# Patient Record
Sex: Male | Born: 1945 | Race: White | Hispanic: No | Marital: Married | Smoking: Current every day smoker
Health system: Southern US, Community
[De-identification: ages and names within clinical notes are randomized; demographics above are authoritative.]

## PROBLEM LIST (undated history)

## (undated) DIAGNOSIS — F329 Major depressive disorder, single episode, unspecified: Secondary | ICD-10-CM

## (undated) DIAGNOSIS — J309 Allergic rhinitis, unspecified: Secondary | ICD-10-CM

## (undated) DIAGNOSIS — F32A Depression, unspecified: Secondary | ICD-10-CM

## (undated) DIAGNOSIS — B059 Measles without complication: Secondary | ICD-10-CM

## (undated) DIAGNOSIS — I4949 Other premature depolarization: Secondary | ICD-10-CM

## (undated) DIAGNOSIS — B269 Mumps without complication: Secondary | ICD-10-CM

## (undated) DIAGNOSIS — J449 Chronic obstructive pulmonary disease, unspecified: Secondary | ICD-10-CM

## (undated) DIAGNOSIS — D649 Anemia, unspecified: Secondary | ICD-10-CM

## (undated) DIAGNOSIS — B019 Varicella without complication: Secondary | ICD-10-CM

## (undated) HISTORY — DX: Allergic rhinitis, unspecified: J30.9

## (undated) HISTORY — DX: Anemia, unspecified: D64.9

## (undated) HISTORY — DX: Varicella without complication: B01.9

## (undated) HISTORY — DX: Mumps without complication: B26.9

## (undated) HISTORY — DX: Other premature depolarization: I49.49

## (undated) HISTORY — PX: KNEE SURGERY: SHX244

## (undated) HISTORY — PX: CHEST SURGERY: SHX595

## (undated) HISTORY — PX: TONSILECTOMY/ADENOIDECTOMY WITH MYRINGOTOMY: SHX6125

## (undated) HISTORY — DX: Measles without complication: B05.9

## (undated) HISTORY — PX: TONSILLECTOMY: SUR1361

---

## 2012-10-08 DIAGNOSIS — F17200 Nicotine dependence, unspecified, uncomplicated: Secondary | ICD-10-CM | POA: Diagnosis not present

## 2012-10-08 DIAGNOSIS — Z0000 Encounter for general adult medical examination without abnormal findings: Secondary | ICD-10-CM | POA: Diagnosis not present

## 2012-10-08 DIAGNOSIS — Z23 Encounter for immunization: Secondary | ICD-10-CM | POA: Diagnosis not present

## 2012-10-15 DIAGNOSIS — F17200 Nicotine dependence, unspecified, uncomplicated: Secondary | ICD-10-CM | POA: Diagnosis not present

## 2012-10-15 DIAGNOSIS — R0602 Shortness of breath: Secondary | ICD-10-CM | POA: Diagnosis not present

## 2012-10-15 DIAGNOSIS — J449 Chronic obstructive pulmonary disease, unspecified: Secondary | ICD-10-CM | POA: Diagnosis not present

## 2012-11-01 DIAGNOSIS — J449 Chronic obstructive pulmonary disease, unspecified: Secondary | ICD-10-CM | POA: Diagnosis not present

## 2012-11-12 DIAGNOSIS — Z23 Encounter for immunization: Secondary | ICD-10-CM | POA: Diagnosis not present

## 2012-11-12 DIAGNOSIS — I4949 Other premature depolarization: Secondary | ICD-10-CM | POA: Diagnosis not present

## 2012-11-12 DIAGNOSIS — J449 Chronic obstructive pulmonary disease, unspecified: Secondary | ICD-10-CM | POA: Diagnosis not present

## 2012-11-12 DIAGNOSIS — F17200 Nicotine dependence, unspecified, uncomplicated: Secondary | ICD-10-CM | POA: Diagnosis not present

## 2013-02-25 DIAGNOSIS — J309 Allergic rhinitis, unspecified: Secondary | ICD-10-CM | POA: Diagnosis not present

## 2013-02-25 DIAGNOSIS — J449 Chronic obstructive pulmonary disease, unspecified: Secondary | ICD-10-CM | POA: Diagnosis not present

## 2013-02-25 DIAGNOSIS — F17200 Nicotine dependence, unspecified, uncomplicated: Secondary | ICD-10-CM | POA: Diagnosis not present

## 2013-05-02 DIAGNOSIS — J449 Chronic obstructive pulmonary disease, unspecified: Secondary | ICD-10-CM | POA: Diagnosis not present

## 2013-05-02 DIAGNOSIS — R0609 Other forms of dyspnea: Secondary | ICD-10-CM | POA: Diagnosis not present

## 2013-08-02 DIAGNOSIS — J449 Chronic obstructive pulmonary disease, unspecified: Secondary | ICD-10-CM | POA: Diagnosis not present

## 2013-08-02 DIAGNOSIS — Z0000 Encounter for general adult medical examination without abnormal findings: Secondary | ICD-10-CM | POA: Diagnosis not present

## 2013-11-08 DIAGNOSIS — J449 Chronic obstructive pulmonary disease, unspecified: Secondary | ICD-10-CM | POA: Diagnosis not present

## 2013-11-08 DIAGNOSIS — J432 Centrilobular emphysema: Secondary | ICD-10-CM | POA: Insufficient documentation

## 2013-11-08 DIAGNOSIS — R05 Cough: Secondary | ICD-10-CM | POA: Diagnosis not present

## 2013-11-08 DIAGNOSIS — R059 Cough, unspecified: Secondary | ICD-10-CM | POA: Diagnosis not present

## 2013-11-08 DIAGNOSIS — J40 Bronchitis, not specified as acute or chronic: Secondary | ICD-10-CM | POA: Diagnosis not present

## 2013-11-28 DIAGNOSIS — Z23 Encounter for immunization: Secondary | ICD-10-CM | POA: Diagnosis not present

## 2014-02-08 DIAGNOSIS — J449 Chronic obstructive pulmonary disease, unspecified: Secondary | ICD-10-CM | POA: Diagnosis not present

## 2014-02-08 DIAGNOSIS — Z23 Encounter for immunization: Secondary | ICD-10-CM | POA: Diagnosis not present

## 2014-02-08 DIAGNOSIS — Z720 Tobacco use: Secondary | ICD-10-CM | POA: Diagnosis not present

## 2014-02-08 DIAGNOSIS — J302 Other seasonal allergic rhinitis: Secondary | ICD-10-CM | POA: Diagnosis not present

## 2014-02-08 DIAGNOSIS — R062 Wheezing: Secondary | ICD-10-CM | POA: Diagnosis not present

## 2014-03-01 DIAGNOSIS — J449 Chronic obstructive pulmonary disease, unspecified: Secondary | ICD-10-CM | POA: Diagnosis not present

## 2014-03-01 LAB — LIPID PANEL
CHOLESTEROL: 234 mg/dL — AB (ref 0–200)
HDL: 51 mg/dL (ref 35–70)
LDL CALC: 146 mg/dL
LDl/HDL Ratio: 2.9
Triglycerides: 185 mg/dL — AB (ref 40–160)

## 2014-03-01 LAB — BASIC METABOLIC PANEL
BUN: 17 mg/dL (ref 4–21)
Creatinine: 1 mg/dL (ref 0.6–1.3)
Glucose: 70 mg/dL
Potassium: 4.3 mmol/L (ref 3.4–5.3)
Sodium: 140 mmol/L (ref 137–147)

## 2014-03-01 LAB — HEPATIC FUNCTION PANEL
ALT: 19 U/L (ref 10–40)
AST: 24 U/L (ref 14–40)
Alkaline Phosphatase: 71 U/L (ref 25–125)
BILIRUBIN, TOTAL: 0.5 mg/dL

## 2014-03-01 LAB — CBC AND DIFFERENTIAL
HCT: 46 % (ref 41–53)
Hemoglobin: 15 g/dL (ref 13.5–17.5)
Neutrophils Absolute: 5 /uL
Platelets: 223 10*3/uL (ref 150–399)
WBC: 7.5 10*3/mL

## 2014-06-07 DIAGNOSIS — J449 Chronic obstructive pulmonary disease, unspecified: Secondary | ICD-10-CM | POA: Diagnosis not present

## 2014-06-10 DIAGNOSIS — J302 Other seasonal allergic rhinitis: Secondary | ICD-10-CM | POA: Insufficient documentation

## 2014-06-10 DIAGNOSIS — F17200 Nicotine dependence, unspecified, uncomplicated: Secondary | ICD-10-CM | POA: Insufficient documentation

## 2014-08-10 ENCOUNTER — Encounter: Payer: Self-pay | Admitting: Family Medicine

## 2014-08-10 ENCOUNTER — Ambulatory Visit (INDEPENDENT_AMBULATORY_CARE_PROVIDER_SITE_OTHER): Payer: Medicare Other | Admitting: Family Medicine

## 2014-08-10 VITALS — BP 139/79 | HR 79 | Resp 16 | Ht 67.0 in | Wt 161.2 lb

## 2014-08-10 DIAGNOSIS — E785 Hyperlipidemia, unspecified: Secondary | ICD-10-CM | POA: Insufficient documentation

## 2014-08-10 DIAGNOSIS — J438 Other emphysema: Secondary | ICD-10-CM | POA: Diagnosis not present

## 2014-08-10 MED ORDER — ATORVASTATIN CALCIUM 10 MG PO TABS: 10.0000 mg | ORAL_TABLET | Freq: Every day | ORAL | Status: DC

## 2014-08-10 NOTE — Progress Notes (Signed)
Name: Derek Mayo   MRN: [id number]    DOB: 07-18-1945   Date:08/10/2014       Progress Note  Subjective  Chief Complaint  Chief Complaint  Patient presents with  . COPD  . Hyperlipidemia    HPI   For f/u of COPD.  Breathing doing well.   Follows with Dr. Raul Del also. Has elevated lipids from recent lab work.  C/o nasal allergies regularly.  Past Medical History  Diagnosis Date  . Chicken pox   . Mumps   . Measles   . Anemia   . Allergic rhinitis   . Premature beats     Past Surgical History  Procedure Laterality Date  . Tonsilectomy/adenoidectomy with myringotomy      Family History  Problem Relation Age of Onset  . Heart disease Father     History   Social History  . Marital Status: Married    Spouse Name: N/A  . Number of Children: N/A  . Years of Education: N/A   Occupational History  . Not on file.   Social History Main Topics  . Smoking status: Current Some Day Smoker    Types: Cigars    Last Attempt to Quit: 08/13/2012  . Smokeless tobacco: Never Used  . Alcohol Use: No  . Drug Use: No     Comment: socially only  . Sexual Activity: Not on file   Other Topics Concern  . Not on file   Social History Narrative     Current outpatient prescriptions:  .  albuterol (VENTOLIN HFA) 108 (90 BASE) MCG/ACT inhaler, INHALE 2 PUFFS 4 TIMES A DAY AS NEEDED, Disp: , Rfl:  .  fluticasone (FLONASE) 50 MCG/ACT nasal spray, Place into the nose., Disp: , Rfl:  .  fluticasone (FLOVENT HFA) 110 MCG/ACT inhaler, Inhale into the lungs., Disp: , Rfl:  .  Umeclidinium Bromide (INCRUSE ELLIPTA) 62.5 MCG/INH AEPB, INHALE 1 INHALATION INTO THE LUNGS ONCE DAILY., Disp: , Rfl:  .  atorvastatin (LIPITOR) 10 MG tablet, Take 1 tablet (10 mg total) by mouth daily., Disp: 90 tablet, Rfl: 3  No Known Allergies   Review of Systems  Constitutional: Negative.  Negative for fever, chills, weight loss and malaise/fatigue.  HENT: Positive for congestion.   Eyes: Negative.   Negative for blurred vision and double vision.  Respiratory: Negative.  Negative for cough, sputum production, shortness of breath and wheezing.   Cardiovascular: Negative.  Negative for chest pain, palpitations, orthopnea, claudication and leg swelling.  Gastrointestinal: Negative.  Negative for nausea, vomiting, abdominal pain and diarrhea.  Musculoskeletal: Negative.   Skin: Negative.  Negative for rash.  Neurological: Negative.  Negative for weakness and headaches.      Objective  Filed Vitals:   08/10/14 0842  BP: 139/79  Pulse: 79  Resp: 16  Height: 5\' 7"  (1.702 m)  Weight: 161 lb 3.2 oz (73.12 kg)  SpO2: 96%    Physical Exam  Constitutional: He is oriented to person, place, and time and well-developed, well-nourished, and in no distress. No distress.  HENT:  Head: Normocephalic and atraumatic.  Right Ear: External ear normal.  Left Ear: External ear normal.  Nose: Mucosal edema and rhinorrhea present. Right sinus exhibits no maxillary sinus tenderness and no frontal sinus tenderness. Left sinus exhibits no maxillary sinus tenderness and no frontal sinus tenderness.  Mouth/Throat: Oropharynx is clear and moist.  Eyes: EOM are normal. Pupils are equal, round, and reactive to light. No scleral icterus.  Neck: Normal range of  motion. Neck supple. No thyromegaly present.  Cardiovascular: Normal rate, regular rhythm, normal heart sounds and intact distal pulses.  Exam reveals no gallop and no friction rub.   No murmur heard. Pulmonary/Chest: Effort normal and breath sounds normal. No respiratory distress. He has no wheezes. He has no rales.  Abdominal: Soft. Bowel sounds are normal. He exhibits no distension. There is no tenderness. There is no rebound.  Musculoskeletal: He exhibits no edema.  Lymphadenopathy:    He has no cervical adenopathy.  Neurological: He is alert and oriented to person, place, and time.  Vitals reviewed.        Assessment & Plan  Problem  List Items Addressed This Visit      Respiratory   Chronic airway obstruction - Primary   Relevant Medications   albuterol (VENTOLIN HFA) 108 (90 BASE) MCG/ACT inhaler   Umeclidinium Bromide (INCRUSE ELLIPTA) 62.5 MCG/INH AEPB   Other Relevant Orders   Pulse oximetry (single)     Other   Dyslipidemia   Relevant Medications   atorvastatin (LIPITOR) 10 MG tablet      Meds ordered this encounter  Medications  . albuterol (VENTOLIN HFA) 108 (90 BASE) MCG/ACT inhaler    Sig: INHALE 2 PUFFS 4 TIMES A DAY AS NEEDED  . Umeclidinium Bromide (INCRUSE ELLIPTA) 62.5 MCG/INH AEPB    Sig: INHALE 1 INHALATION INTO THE LUNGS ONCE DAILY.  Marland Kitchen atorvastatin (LIPITOR) 10 MG tablet    Sig: Take 1 tablet (10 mg total) by mouth daily.    Dispense:  90 tablet    Refill:  3    1. Other emphysema  - Pulse oximetry (single); Future  2. Dyslipidemia  - atorvastatin (LIPITOR) 10 MG tablet; Take 1 tablet (10 mg total) by mouth daily.  Dispense: 90 tablet; Refill: 3

## 2014-08-10 NOTE — Patient Instructions (Signed)
Plan lipid panel prior to return.

## 2014-10-27 ENCOUNTER — Telehealth: Payer: Self-pay | Admitting: Family Medicine

## 2014-10-27 NOTE — Telephone Encounter (Signed)
Pt was told to come by and pick up lab order about 2 weeks before appt to check cholesterol.  Please call [phone] when ready to be picked up.Marland Kitchen

## 2014-10-31 ENCOUNTER — Other Ambulatory Visit: Payer: Self-pay | Admitting: Family Medicine

## 2014-10-31 ENCOUNTER — Other Ambulatory Visit: Payer: Self-pay

## 2014-10-31 DIAGNOSIS — E785 Hyperlipidemia, unspecified: Secondary | ICD-10-CM

## 2014-10-31 DIAGNOSIS — J441 Chronic obstructive pulmonary disease with (acute) exacerbation: Secondary | ICD-10-CM

## 2014-10-31 NOTE — Telephone Encounter (Signed)
Pt notified reg lab requisition and advised that it's good for 6 week from today.

## 2014-10-31 NOTE — Telephone Encounter (Signed)
He should have orders for a Lipid Panel and a CMP.-jh

## 2014-10-31 NOTE — Telephone Encounter (Signed)
Does he need lipid panel , BMP and CBC or anything more he has last lab done in 02/2014 and heptic function test too. Please suggest ? Derek Mayo

## 2014-10-31 NOTE — Telephone Encounter (Signed)
Lab is ordered and LMTCB

## 2014-11-01 DIAGNOSIS — J441 Chronic obstructive pulmonary disease with (acute) exacerbation: Secondary | ICD-10-CM | POA: Diagnosis not present

## 2014-11-01 DIAGNOSIS — E785 Hyperlipidemia, unspecified: Secondary | ICD-10-CM | POA: Diagnosis not present

## 2014-11-02 LAB — COMPREHENSIVE METABOLIC PANEL
ALK PHOS: 73 IU/L (ref 39–117)
ALT: 23 IU/L (ref 0–44)
AST: 25 IU/L (ref 0–40)
Albumin/Globulin Ratio: 2.1 (ref 1.1–2.5)
Albumin: 4.5 g/dL (ref 3.6–4.8)
BUN/Creatinine Ratio: 17 (ref 10–22)
BUN: 17 mg/dL (ref 8–27)
Bilirubin Total: 0.5 mg/dL (ref 0.0–1.2)
CO2: 24 mmol/L (ref 18–29)
Calcium: 10 mg/dL (ref 8.6–10.2)
Chloride: 99 mmol/L (ref 97–108)
Creatinine, Ser: 1.03 mg/dL (ref 0.76–1.27)
GFR calc Af Amer: 86 mL/min/{1.73_m2} (ref >59)
GFR calc non Af Amer: 75 mL/min/{1.73_m2} (ref >59)
Globulin, Total: 2.1 g/dL (ref 1.5–4.5)
Glucose: 92 mg/dL (ref 65–99)
POTASSIUM: 4.6 mmol/L (ref 3.5–5.2)
Sodium: 140 mmol/L (ref 134–144)
Total Protein: 6.6 g/dL (ref 6.0–8.5)

## 2014-11-02 LAB — LIPID PANEL
CHOLESTEROL TOTAL: 170 mg/dL (ref 100–199)
Chol/HDL Ratio: 3.4 ratio units (ref 0.0–5.0)
HDL: 50 mg/dL (ref >39)
LDL CALC: 97 mg/dL (ref 0–99)
Triglycerides: 116 mg/dL (ref 0–149)
VLDL CHOLESTEROL CAL: 23 mg/dL (ref 5–40)

## 2014-11-02 NOTE — Progress Notes (Signed)
Mailed

## 2014-11-13 ENCOUNTER — Ambulatory Visit (INDEPENDENT_AMBULATORY_CARE_PROVIDER_SITE_OTHER): Payer: Medicare Other | Admitting: Family Medicine

## 2014-11-13 ENCOUNTER — Encounter: Payer: Self-pay | Admitting: Family Medicine

## 2014-11-13 VITALS — BP 115/78 | HR 78 | Temp 97.8°F | Resp 16 | Ht 67.0 in | Wt 160.4 lb

## 2014-11-13 DIAGNOSIS — J449 Chronic obstructive pulmonary disease, unspecified: Secondary | ICD-10-CM | POA: Diagnosis not present

## 2014-11-13 DIAGNOSIS — Z23 Encounter for immunization: Secondary | ICD-10-CM | POA: Diagnosis not present

## 2014-11-13 DIAGNOSIS — E785 Hyperlipidemia, unspecified: Secondary | ICD-10-CM | POA: Diagnosis not present

## 2014-11-13 MED ORDER — ATORVASTATIN CALCIUM 10 MG PO TABS: 10.0000 mg | ORAL_TABLET | Freq: Every day | ORAL | Status: DC

## 2014-11-13 NOTE — Progress Notes (Signed)
Name: Derek Mayo   MRN: [id number]    DOB: 1946/01/13   Date:11/13/2014       Progress Note  Subjective  Chief Complaint  Chief Complaint  Patient presents with  . COPD    HPI  Here for f/u of COPD.  Had a "cold" last week, but no fever.  Only coughed up some clear sputum .  No SOB extra.  Has good routine for COPD care.   Allergies doing well. No problem-specific assessment & plan notes found for this encounter.   Past Medical History  Diagnosis Date  . Chicken pox   . Mumps   . Measles   . Anemia   . Allergic rhinitis   . Premature beats     Social History  Substance Use Topics  . Smoking status: Current Some Day Smoker    Types: Cigars    Last Attempt to Quit: 08/13/2012  . Smokeless tobacco: Never Used  . Alcohol Use: No     Current outpatient prescriptions:  .  albuterol (VENTOLIN HFA) 108 (90 BASE) MCG/ACT inhaler, INHALE 2 PUFFS 4 TIMES A DAY AS NEEDED, Disp: , Rfl:  .  atorvastatin (LIPITOR) 10 MG tablet, Take 1 tablet (10 mg total) by mouth daily., Disp: 90 tablet, Rfl: 3 .  fluticasone (FLONASE) 50 MCG/ACT nasal spray, Place into the nose., Disp: , Rfl:  .  fluticasone (FLOVENT HFA) 110 MCG/ACT inhaler, INHALE 1 PUFF TWICE A DAY, Disp: , Rfl:  .  Umeclidinium Bromide (INCRUSE ELLIPTA) 62.5 MCG/INH AEPB, INHALE 1 INHALATION INTO THE LUNGS ONCE DAILY., Disp: , Rfl:   No Known Allergies  Review of Systems  Constitutional: Negative for fever, chills, weight loss and malaise/fatigue.  HENT: Positive for congestion. Negative for hearing loss.   Eyes: Negative for blurred vision and double vision.  Respiratory: Positive for cough, sputum production (clear) and wheezing (/-.  Improving). Negative for shortness of breath.   Cardiovascular: Negative for chest pain, palpitations and leg swelling.  Gastrointestinal: Negative for heartburn, abdominal pain and blood in stool.  Genitourinary: Negative for dysuria, urgency and frequency.  Skin: Negative for rash.   Neurological: Negative for weakness and headaches.      Objective  Filed Vitals:   11/13/14 1611  BP: 115/78  Pulse: 78  Temp: 97.8 F (36.6 C)  Resp: 16  Height: 5\' 7"  (1.702 m)  Weight: 160 lb 6.4 oz (72.757 kg)  SpO2: 95%     Physical Exam  Constitutional: He is oriented to person, place, and time and well-developed, well-nourished, and in no distress. No distress.  HENT:  Head: Normocephalic and atraumatic.  Eyes: Conjunctivae and EOM are normal. Pupils are equal, round, and reactive to light. No scleral icterus.  Neck: Normal range of motion. Neck supple. Carotid bruit is not present. No thyromegaly present.  Cardiovascular: Normal rate, regular rhythm, normal heart sounds and intact distal pulses.  Exam reveals no gallop and no friction rub.   No murmur heard. Pulmonary/Chest: Effort normal. No respiratory distress. He has wheezes (some end expiratory wheezes that clear with cough.). He has no rales.  Abdominal: Soft. Bowel sounds are normal. He exhibits no distension and no mass. There is no tenderness.  Musculoskeletal: He exhibits no edema.  Lymphadenopathy:    He has no cervical adenopathy.  Neurological: He is alert and oriented to person, place, and time.  Vitals reviewed.     Recent Results (from the past 2160 hour(s))  Lipid Profile     Status: None  Collection Time: 11/01/14  8:57 AM  Result Value Ref Range   Cholesterol, Total 170 100 - 199 mg/dL   Triglycerides 116 0 - 149 mg/dL   HDL 50 >39 mg/dL    Comment: According to ATP-III Guidelines, HDL-C >59 mg/dL is considered a negative risk factor for CHD.    VLDL Cholesterol Cal 23 5 - 40 mg/dL   LDL Calculated 97 0 - 99 mg/dL   Chol/HDL Ratio 3.4 0.0 - 5.0 ratio units    Comment:                                   T. Chol/HDL Ratio                                             Men  Women                               1/2 Avg.Risk  3.4    3.3                                   Avg.Risk  5.0    4.4                                 2X Avg.Risk  9.6    7.1                                3X Avg.Risk 23.4   11.0   Comprehensive metabolic panel     Status: None   Collection Time: 11/01/14  8:57 AM  Result Value Ref Range   Glucose 92 65 - 99 mg/dL   BUN 17 8 - 27 mg/dL   Creatinine, Ser 1.03 0.76 - 1.27 mg/dL   GFR calc non Af Amer 75 >59 mL/min/1.73   GFR calc Af Amer 86 >59 mL/min/1.73   BUN/Creatinine Ratio 17 10 - 22   Sodium 140 134 - 144 mmol/L   Potassium 4.6 3.5 - 5.2 mmol/L   Chloride 99 97 - 108 mmol/L   CO2 24 18 - 29 mmol/L   Calcium 10.0 8.6 - 10.2 mg/dL   Total Protein 6.6 6.0 - 8.5 g/dL   Albumin 4.5 3.6 - 4.8 g/dL   Globulin, Total 2.1 1.5 - 4.5 g/dL   Albumin/Globulin Ratio 2.1 1.1 - 2.5   Bilirubin Total 0.5 0.0 - 1.2 mg/dL   Alkaline Phosphatase 73 39 - 117 IU/L   AST 25 0 - 40 IU/L   ALT 23 0 - 44 IU/L     Assessment & Plan  1. COPD, severe Cont current meds 2. Dyslipidemia Cont. Current med  3. Need for influenza vaccination  - Flu vaccine HIGH DOSE PF (Fluzone High dose)

## 2014-11-13 NOTE — Patient Instructions (Signed)
Cont. To see and follow Dr. Gust Brooms recommendations.

## 2014-12-06 DIAGNOSIS — J432 Centrilobular emphysema: Secondary | ICD-10-CM | POA: Diagnosis not present

## 2014-12-06 DIAGNOSIS — R0602 Shortness of breath: Secondary | ICD-10-CM | POA: Diagnosis not present

## 2015-03-19 ENCOUNTER — Encounter: Payer: Self-pay | Admitting: Family Medicine

## 2015-03-19 ENCOUNTER — Ambulatory Visit (INDEPENDENT_AMBULATORY_CARE_PROVIDER_SITE_OTHER): Payer: Medicare Other | Admitting: Family Medicine

## 2015-03-19 VITALS — BP 135/80 | HR 78 | Temp 97.9°F | Resp 16 | Wt 161.0 lb

## 2015-03-19 DIAGNOSIS — F32A Depression, unspecified: Secondary | ICD-10-CM | POA: Insufficient documentation

## 2015-03-19 DIAGNOSIS — J449 Chronic obstructive pulmonary disease, unspecified: Secondary | ICD-10-CM | POA: Diagnosis not present

## 2015-03-19 DIAGNOSIS — Z0000 Encounter for general adult medical examination without abnormal findings: Secondary | ICD-10-CM

## 2015-03-19 DIAGNOSIS — F329 Major depressive disorder, single episode, unspecified: Secondary | ICD-10-CM | POA: Diagnosis not present

## 2015-03-19 DIAGNOSIS — E785 Hyperlipidemia, unspecified: Secondary | ICD-10-CM

## 2015-03-19 MED ORDER — BUPROPION HCL ER (XL) 150 MG PO TB24: 150.0000 mg | ORAL_TABLET | Freq: Every day | ORAL | Status: DC

## 2015-03-19 NOTE — Progress Notes (Signed)
Name: Derek Mayo   MRN: [id number]    DOB: [birth year]   Date:03/19/2015       Progress Note  Subjective  Chief Complaint  Chief Complaint  Patient presents with  . COPD  . dyslipidemia    HPI Here for f/u of COPD and lipidemia.  He is still smoking (Vaps) and cigars. He states that he gets depressed this time of year.  Wants to take Wellbutrin because it has worked well in the past.  No problem-specific assessment & plan notes found for this encounter.   Past Medical History  Diagnosis Date  . Chicken pox   . Mumps   . Measles   . Anemia   . Allergic rhinitis   . Premature beats     Past Surgical History  Procedure Laterality Date  . Tonsilectomy/adenoidectomy with myringotomy      Family History  Problem Relation Age of Onset  . Heart disease Father     Social History   Social History  . Marital Status: Married    Spouse Name: N/A  . Number of Children: N/A  . Years of Education: N/A   Occupational History  . Not on file.   Social History Main Topics  . Smoking status: Current Some Day Smoker    Types: Cigars    Last Attempt to Quit: 08/13/2012  . Smokeless tobacco: Never Used  . Alcohol Use: No  . Drug Use: No     Comment: socially only  . Sexual Activity: Not on file   Other Topics Concern  . Not on file   Social History Narrative     Current outpatient prescriptions:  .  albuterol (VENTOLIN HFA) 108 (90 BASE) MCG/ACT inhaler, INHALE 2 PUFFS 4 TIMES A DAY AS NEEDED, Disp: , Rfl:  .  atorvastatin (LIPITOR) 10 MG tablet, Take 1 tablet (10 mg total) by mouth daily., Disp: 90 tablet, Rfl: 3 .  fluticasone (FLONASE) 50 MCG/ACT nasal spray, Place into the nose., Disp: , Rfl:  .  fluticasone (FLOVENT HFA) 110 MCG/ACT inhaler, INHALE 1 PUFF TWICE A DAY, Disp: , Rfl:  .  Umeclidinium Bromide (INCRUSE ELLIPTA) 62.5 MCG/INH AEPB, INHALE 1 INHALATION INTO THE LUNGS ONCE DAILY., Disp: , Rfl:  .  buPROPion (WELLBUTRIN XL) 150 MG 24 hr tablet, Take 1  tablet (150 mg total) by mouth daily., Disp: 30 tablet, Rfl: 34  Not on File   Review of Systems  Constitutional: Negative for fever, chills, weight loss and malaise/fatigue.  HENT: Negative for hearing loss.   Eyes: Negative for blurred vision and double vision.  Respiratory: Negative for cough, shortness of breath and wheezing.   Cardiovascular: Negative for chest pain, palpitations and leg swelling.  Gastrointestinal: Negative for heartburn, abdominal pain and blood in stool.  Genitourinary: Negative for dysuria, urgency and frequency.  Skin: Negative for rash.  Neurological: Negative for weakness and headaches.  Psychiatric/Behavioral: Positive for depression.      Objective  Filed Vitals:   03/19/15 1621 03/19/15 1653  BP: 143/86 135/80  Pulse: 78   Temp: 97.9 F (36.6 C)   TempSrc: Oral   Resp: 16   Weight: 161 lb (73.029 kg)     Physical Exam  Constitutional: He is oriented to person, place, and time and well-developed, well-nourished, and in no distress. No distress.  HENT:  Head: Normocephalic and atraumatic.  Eyes: Conjunctivae and EOM are normal. Pupils are equal, round, and reactive to light. No scleral icterus.  Neck: Normal range of motion.  Neck supple. Carotid bruit is not present. No thyromegaly present.  Cardiovascular: Normal rate, regular rhythm and normal heart sounds.  Exam reveals no gallop and no friction rub.   No murmur heard. Pulmonary/Chest: Effort normal and breath sounds normal. No respiratory distress. He has no wheezes. He has no rales.  Abdominal: Soft. Bowel sounds are normal. He exhibits no distension and no mass. There is no tenderness.  Musculoskeletal: He exhibits no edema.  Lymphadenopathy:    He has no cervical adenopathy.  Neurological: He is alert and oriented to person, place, and time.  Psychiatric:  Affect is mildly depressed.  Vitals reviewed.      No results found for this or any previous visit (from the past 2160  hour(s)).   Assessment & Plan  Problem List Items Addressed This Visit      Respiratory   COPD, severe ([place])     Other   Dyslipidemia   Depression - Primary   Relevant Medications   buPROPion (WELLBUTRIN XL) 150 MG 24 hr tablet    Other Visit Diagnoses    Routine health maintenance        Relevant Orders    Hepatitis C Antibody    Cologuard       Meds ordered this encounter  Medications  . buPROPion (WELLBUTRIN XL) 150 MG 24 hr tablet    Sig: Take 1 tablet (150 mg total) by mouth daily.    Dispense:  30 tablet    Refill:  34   1. Depression  - buPROPion (WELLBUTRIN XL) 150 MG 24 hr tablet; Take 1 tablet (150 mg total) by mouth daily.  Dispense: 30 tablet; Refill: 34  2. COPD, severe ([place])  Stop Vaping and cigars Cont. inhalers 3. Dyslipidemia  Cont Atorvastatin 4. Routine health maintenance  - Hepatitis C Antibody - Cologuard

## 2015-04-12 DIAGNOSIS — Z1212 Encounter for screening for malignant neoplasm of rectum: Secondary | ICD-10-CM | POA: Diagnosis not present

## 2015-04-12 DIAGNOSIS — Z1211 Encounter for screening for malignant neoplasm of colon: Secondary | ICD-10-CM | POA: Diagnosis not present

## 2015-04-18 ENCOUNTER — Other Ambulatory Visit: Payer: Self-pay | Admitting: Family Medicine

## 2015-04-28 LAB — COLOGUARD: COLOGUARD: POSITIVE

## 2015-04-30 ENCOUNTER — Telehealth: Payer: Self-pay | Admitting: *Deleted

## 2015-04-30 DIAGNOSIS — R899 Unspecified abnormal finding in specimens from other organs, systems and tissues: Secondary | ICD-10-CM

## 2015-04-30 NOTE — Telephone Encounter (Signed)
Tye Maryland called from [employer] with [employer]. Test result was positive, copy being faxed.

## 2015-04-30 NOTE — Telephone Encounter (Signed)
Schedule patient with Dr. Chauncey Cruel

## 2015-05-04 ENCOUNTER — Telehealth: Payer: Self-pay | Admitting: *Deleted

## 2015-05-04 NOTE — Telephone Encounter (Signed)
Pt stated he was unaware his cologuard was positive. Would like to speak with Dr. Luan Pulling prior to scheduling. Pt stated he cannot be put under sedation as he has COPD. Pt will call me back.

## 2015-05-07 NOTE — Telephone Encounter (Signed)
Go ahead ans schedule an appt. For me to see him to discuss colonoscopy and COPD. =-jh

## 2015-05-07 NOTE — Telephone Encounter (Signed)
appt scheduled

## 2015-05-09 ENCOUNTER — Encounter: Payer: Self-pay | Admitting: Family Medicine

## 2015-05-09 ENCOUNTER — Ambulatory Visit (INDEPENDENT_AMBULATORY_CARE_PROVIDER_SITE_OTHER): Payer: Medicare HMO | Admitting: Family Medicine

## 2015-05-09 VITALS — BP 145/95 | HR 79 | Temp 97.7°F | Resp 16 | Ht 67.0 in | Wt 166.0 lb

## 2015-05-09 DIAGNOSIS — J449 Chronic obstructive pulmonary disease, unspecified: Secondary | ICD-10-CM | POA: Diagnosis not present

## 2015-05-09 DIAGNOSIS — F329 Major depressive disorder, single episode, unspecified: Secondary | ICD-10-CM

## 2015-05-09 DIAGNOSIS — Z1211 Encounter for screening for malignant neoplasm of colon: Secondary | ICD-10-CM | POA: Diagnosis not present

## 2015-05-09 DIAGNOSIS — R69 Illness, unspecified: Secondary | ICD-10-CM | POA: Diagnosis not present

## 2015-05-09 DIAGNOSIS — F32A Depression, unspecified: Secondary | ICD-10-CM

## 2015-05-09 MED ORDER — BUPROPION HCL ER (XL) 150 MG PO TB24: 150.0000 mg | ORAL_TABLET | Freq: Every day | ORAL | Status: DC

## 2015-05-09 NOTE — Progress Notes (Signed)
Name: Derek Mayo   MRN: [id number]    DOB: 16-Jul-1945   Date:05/09/2015       Progress Note  Subjective  Chief Complaint  Chief Complaint  Patient presents with  . positive cologuard    HPI Here to discuss positive cologuard.  He ha COPD.  Feels that he has a hard time recoveriong from anesthesia.   No problem-specific assessment & plan notes found for this encounter.   Past Medical History  Diagnosis Date  . Chicken pox   . Mumps   . Measles   . Anemia   . Allergic rhinitis   . Premature beats     Past Surgical History  Procedure Laterality Date  . Tonsilectomy/adenoidectomy with myringotomy      Family History  Problem Relation Age of Onset  . Heart disease Father     Social History   Social History  . Marital Status: Married    Spouse Name: N/A  . Number of Children: N/A  . Years of Education: N/A   Occupational History  . Not on file.   Social History Main Topics  . Smoking status: Former Smoker    Types: Cigars    Quit date: 08/13/2012  . Smokeless tobacco: Never Used  . Alcohol Use: No  . Drug Use: No     Comment: socially only  . Sexual Activity: Not on file   Other Topics Concern  . Not on file   Social History Narrative     Current outpatient prescriptions:  .  albuterol (VENTOLIN HFA) 108 (90 BASE) MCG/ACT inhaler, INHALE 2 PUFFS 4 TIMES A DAY AS NEEDED, Disp: , Rfl:  .  atorvastatin (LIPITOR) 10 MG tablet, Take 1 tablet (10 mg total) by mouth daily., Disp: 90 tablet, Rfl: 3 .  buPROPion (WELLBUTRIN XL) 150 MG 24 hr tablet, Take 1 tablet (150 mg total) by mouth daily., Disp: 30 tablet, Rfl: 12 .  fluticasone (FLONASE) 50 MCG/ACT nasal spray, USE 2 SPRAYS IN EACH NOSTRIL DAILY, Disp: 48 g, Rfl: 3 .  fluticasone (FLOVENT HFA) 110 MCG/ACT inhaler, INHALE 1 PUFF TWICE A DAY, Disp: , Rfl:  .  Umeclidinium Bromide (INCRUSE ELLIPTA) 62.5 MCG/INH AEPB, INHALE 1 INHALATION INTO THE LUNGS ONCE DAILY., Disp: , Rfl:   No Known  Allergies   Review of Systems  Constitutional: Negative for fever, chills, weight loss and malaise/fatigue.  HENT: Negative for hearing loss.   Eyes: Negative for blurred vision and double vision.  Respiratory: Negative for cough, shortness of breath and wheezing.   Cardiovascular: Negative for chest pain, palpitations and leg swelling.  Gastrointestinal: Positive for heartburn (with certain foods.). Negative for nausea, vomiting, abdominal pain, diarrhea, blood in stool and melena.  Genitourinary: Negative for dysuria, urgency and frequency.  Musculoskeletal: Negative for myalgias and joint pain.  Skin: Negative for rash.  Neurological: Negative for tremors, weakness and headaches.      Objective  Filed Vitals:   05/09/15 0925  BP: 145/95  Pulse: 79  Temp: 97.7 F (36.5 C)  TempSrc: Oral  Resp: 16  Height: 5\' 7"  (1.702 m)  Weight: 166 lb (75.297 kg)  SpO2: 98%    Physical Exam  Constitutional: He is oriented to person, place, and time and well-developed, well-nourished, and in no distress. No distress.  HENT:  Head: Normocephalic and atraumatic.  Eyes: Conjunctivae and EOM are normal. Pupils are equal, round, and reactive to light. No scleral icterus.  Neck: Normal range of motion. Neck supple. Carotid bruit is  not present. No thyromegaly present.  Cardiovascular: Normal rate, regular rhythm and normal heart sounds.  Exam reveals no gallop and no friction rub.   No murmur heard. Pulmonary/Chest: Effort normal and breath sounds normal. No respiratory distress. He has no wheezes. He has no rales.  Musculoskeletal: He exhibits no edema.  Lymphadenopathy:    He has no cervical adenopathy.  Neurological: He is alert and oriented to person, place, and time.  Vitals reviewed.      No results found for this or any previous visit (from the past 2160 hour(s)).   Assessment & Plan  Problem List Items Addressed This Visit      Respiratory   COPD, severe ([place]) -  Primary   Relevant Orders   Ambulatory referral to Pulmonology     Other   Depression   Relevant Medications   buPROPion (WELLBUTRIN XL) 150 MG 24 hr tablet   Colon cancer screening   Relevant Orders   Ambulatory referral to Gastroenterology      Meds ordered this encounter  Medications  . buPROPion (WELLBUTRIN XL) 150 MG 24 hr tablet    Sig: Take 1 tablet (150 mg total) by mouth daily.    Dispense:  30 tablet    Refill:  12   1. COPD, severe ([place])  - Ambulatory referral to Pulmonology  2. Depression  - buPROPion (WELLBUTRIN XL) 150 MG 24 hr tablet; Take 1 tablet (150 mg total) by mouth daily.  Dispense: 30 tablet; Refill: 12  3. Colon cancer screening  - Ambulatory referral to Gastroenterology

## 2015-05-14 ENCOUNTER — Other Ambulatory Visit: Payer: Self-pay | Admitting: *Deleted

## 2015-05-14 DIAGNOSIS — F329 Major depressive disorder, single episode, unspecified: Secondary | ICD-10-CM

## 2015-05-14 DIAGNOSIS — F32A Depression, unspecified: Secondary | ICD-10-CM

## 2015-05-14 MED ORDER — BUPROPION HCL ER (XL) 150 MG PO TB24: 150.0000 mg | ORAL_TABLET | Freq: Every day | ORAL | Status: DC

## 2015-05-16 DIAGNOSIS — J449 Chronic obstructive pulmonary disease, unspecified: Secondary | ICD-10-CM | POA: Diagnosis not present

## 2015-05-16 DIAGNOSIS — Z01818 Encounter for other preprocedural examination: Secondary | ICD-10-CM | POA: Diagnosis not present

## 2015-05-16 DIAGNOSIS — R69 Illness, unspecified: Secondary | ICD-10-CM | POA: Diagnosis not present

## 2015-05-16 DIAGNOSIS — R0602 Shortness of breath: Secondary | ICD-10-CM | POA: Diagnosis not present

## 2015-06-01 DIAGNOSIS — R195 Other fecal abnormalities: Secondary | ICD-10-CM | POA: Diagnosis not present

## 2015-06-13 DIAGNOSIS — R69 Illness, unspecified: Secondary | ICD-10-CM | POA: Diagnosis not present

## 2015-06-22 ENCOUNTER — Encounter: Payer: Self-pay | Admitting: *Deleted

## 2015-06-25 ENCOUNTER — Ambulatory Visit: Payer: Medicare HMO | Admitting: Anesthesiology

## 2015-06-25 ENCOUNTER — Ambulatory Visit
Admission: RE | Admit: 2015-06-25 | Discharge: 2015-06-25 | Disposition: A | Discharge status: Home Health | Payer: Medicare HMO | Source: Ambulatory Visit | Attending: Gastroenterology | Admitting: Gastroenterology

## 2015-06-25 ENCOUNTER — Encounter
Admission: RE | Disposition: A | Discharge status: Home Health | Payer: Self-pay | Source: Ambulatory Visit | Attending: Gastroenterology

## 2015-06-25 ENCOUNTER — Encounter: Payer: Self-pay | Admitting: *Deleted

## 2015-06-25 DIAGNOSIS — J449 Chronic obstructive pulmonary disease, unspecified: Secondary | ICD-10-CM | POA: Diagnosis not present

## 2015-06-25 DIAGNOSIS — Z87891 Personal history of nicotine dependence: Secondary | ICD-10-CM | POA: Insufficient documentation

## 2015-06-25 DIAGNOSIS — Z79810 Long term (current) use of selective estrogen receptor modulators (SERMs): Secondary | ICD-10-CM | POA: Insufficient documentation

## 2015-06-25 DIAGNOSIS — K640 First degree hemorrhoids: Secondary | ICD-10-CM | POA: Diagnosis not present

## 2015-06-25 DIAGNOSIS — Z1211 Encounter for screening for malignant neoplasm of colon: Secondary | ICD-10-CM | POA: Diagnosis not present

## 2015-06-25 DIAGNOSIS — D127 Benign neoplasm of rectosigmoid junction: Secondary | ICD-10-CM | POA: Diagnosis not present

## 2015-06-25 DIAGNOSIS — K5790 Diverticulosis of intestine, part unspecified, without perforation or abscess without bleeding: Secondary | ICD-10-CM | POA: Diagnosis not present

## 2015-06-25 DIAGNOSIS — J309 Allergic rhinitis, unspecified: Secondary | ICD-10-CM | POA: Insufficient documentation

## 2015-06-25 DIAGNOSIS — D123 Benign neoplasm of transverse colon: Secondary | ICD-10-CM | POA: Insufficient documentation

## 2015-06-25 DIAGNOSIS — F419 Anxiety disorder, unspecified: Secondary | ICD-10-CM | POA: Diagnosis not present

## 2015-06-25 DIAGNOSIS — K635 Polyp of colon: Secondary | ICD-10-CM | POA: Diagnosis not present

## 2015-06-25 DIAGNOSIS — K621 Rectal polyp: Secondary | ICD-10-CM | POA: Diagnosis not present

## 2015-06-25 DIAGNOSIS — F329 Major depressive disorder, single episode, unspecified: Secondary | ICD-10-CM | POA: Diagnosis not present

## 2015-06-25 DIAGNOSIS — K5730 Diverticulosis of large intestine without perforation or abscess without bleeding: Secondary | ICD-10-CM | POA: Insufficient documentation

## 2015-06-25 DIAGNOSIS — D128 Benign neoplasm of rectum: Secondary | ICD-10-CM | POA: Diagnosis not present

## 2015-06-25 DIAGNOSIS — Z79899 Other long term (current) drug therapy: Secondary | ICD-10-CM | POA: Insufficient documentation

## 2015-06-25 HISTORY — DX: Major depressive disorder, single episode, unspecified: F32.9

## 2015-06-25 HISTORY — DX: Depression, unspecified: F32.A

## 2015-06-25 HISTORY — PX: COLONOSCOPY WITH PROPOFOL: SHX5780

## 2015-06-25 HISTORY — DX: Chronic obstructive pulmonary disease, unspecified: J44.9

## 2015-06-25 SURGERY — COLONOSCOPY WITH PROPOFOL
Anesthesia: General

## 2015-06-25 MED ORDER — FENTANYL CITRATE (PF) 100 MCG/2ML IJ SOLN
INTRAMUSCULAR | Status: DC | PRN
  Administered 2015-06-25: 50 ug via INTRAVENOUS

## 2015-06-25 MED ORDER — LIDOCAINE HCL (CARDIAC) 20 MG/ML IV SOLN
INTRAVENOUS | Status: DC | PRN
Start: 2015-06-25 — End: 2015-06-25
  Administered 2015-06-25: 30 mg via INTRAVENOUS

## 2015-06-25 MED ORDER — PHENYLEPHRINE HCL 10 MG/ML IJ SOLN
INTRAMUSCULAR | Status: DC | PRN
  Administered 2015-06-25: 100 ug via INTRAVENOUS

## 2015-06-25 MED ORDER — PROPOFOL 10 MG/ML IV BOLUS
INTRAVENOUS | Status: DC | PRN
  Administered 2015-06-25: 100 mg via INTRAVENOUS

## 2015-06-25 MED ORDER — SODIUM CHLORIDE 0.9 % IV SOLN
INTRAVENOUS | Status: DC
  Administered 2015-06-25 (×2): via INTRAVENOUS

## 2015-06-25 MED ORDER — PROPOFOL 500 MG/50ML IV EMUL
INTRAVENOUS | Status: DC | PRN
  Administered 2015-06-25: 160 ug/kg/min via INTRAVENOUS

## 2015-06-25 MED ORDER — MIDAZOLAM HCL 5 MG/5ML IJ SOLN
INTRAMUSCULAR | Status: DC | PRN
  Administered 2015-06-25: 1 mg via INTRAVENOUS

## 2015-06-25 NOTE — Anesthesia Preprocedure Evaluation (Signed)
Anesthesia Evaluation  Patient identified by MRN, date of birth, ID band Patient awake    Reviewed: Allergy & Precautions, H&P , NPO status , Patient's Chart, lab work & pertinent test results, reviewed documented beta blocker date and time   Airway Mallampati: II   Neck ROM: full    Dental  (+) Poor Dentition   Pulmonary neg pulmonary ROS, COPD, former smoker,    Pulmonary exam normal        Cardiovascular negative cardio ROS Normal cardiovascular exam     Neuro/Psych PSYCHIATRIC DISORDERS negative neurological ROS  negative psych ROS   GI/Hepatic negative GI ROS, Neg liver ROS,   Endo/Other  negative endocrine ROS  Renal/GU negative Renal ROS  negative genitourinary   Musculoskeletal   Abdominal   Peds  Hematology negative hematology ROS (+) anemia ,   Anesthesia Other Findings Past Medical History:   Chicken pox                                                  Mumps                                                        Measles                                                      Anemia                                                       Allergic rhinitis                                            Premature beats                                              COPD (chronic obstructive pulmonary disease) (*            Past Surgical History:   TONSILECTOMY/ADENOIDECTOMY WITH MYRINGOTOMY                   TONSILLECTOMY                                                 Reproductive/Obstetrics                             Anesthesia Physical Anesthesia Plan  ASA: III  Anesthesia Plan: General   Post-op Pain Management:  Induction:   Airway Management Planned:   Additional Equipment:   Intra-op Plan:   Post-operative Plan:   Informed Consent: I have reviewed the patients History and Physical, chart, labs and discussed the procedure including the risks, benefits and  alternatives for the proposed anesthesia with the patient or authorized representative who has indicated his/her understanding and acceptance.   Dental Advisory Given  Plan Discussed with: CRNA  Anesthesia Plan Comments:         Anesthesia Quick Evaluation

## 2015-06-25 NOTE — Anesthesia Postprocedure Evaluation (Signed)
Anesthesia Post Note  Patient: Derek Mayo  Procedure(s) Performed: Procedure(s) (LRB): COLONOSCOPY WITH PROPOFOL (N/A)  Patient location during evaluation: Endoscopy Anesthesia Type: General Level of consciousness: awake Pain management: pain level controlled Vital Signs Assessment: post-procedure vital signs reviewed and stable Respiratory status: spontaneous breathing Cardiovascular status: blood pressure returned to baseline Postop Assessment: no headache Anesthetic complications: no    Last Vitals:  Filed Vitals:   06/25/15 1500 06/25/15 1501  BP: 111/68 111/68  Pulse: 71 73  Temp: 36.3 C 36.3 C  Resp: 18 18    Last Pain: There were no vitals filed for this visit.               Corissa Oguinn M

## 2015-06-25 NOTE — Op Note (Signed)
[facility] Gastroenterology Patient Name: Derek Mayo Procedure Date: 06/25/2015 2:28 PM MRN: [id number] Account #: [id number] Date of Birth: [birth year] Admit Type: Outpatient Age: 70 Room: [facility] ENDO ROOM 4 Gender: Male Note Status: Finalized Procedure:            Colonoscopy Indications:          This is the patient's first colonoscopy, Positive                        Cologuard test Patient Profile:      This is a 70 year old male. Providers:            Gerrit Heck. Rayann Heman, MD Referring MD:         Arlis Porta, MD (Referring MD) Medicines:            Propofol per Anesthesia Complications:        No immediate complications. Estimated blood loss:                        Minimal. Procedure:            Pre-Anesthesia Assessment:                       - Prior to the procedure, a History and Physical was                        performed, and patient medications, allergies and                        sensitivities were reviewed. The patient's tolerance of                        previous anesthesia was reviewed.                       After obtaining informed consent, the colonoscope was                        passed under direct vision. Throughout the procedure,                        the patient's blood pressure, pulse, and oxygen                        saturations were monitored continuously. The                        Colonoscope was introduced through the anus and                        advanced to the the terminal ileum. The colonoscopy was                        performed without difficulty. The patient tolerated the                        procedure well. The quality of the bowel preparation                        was excellent. Findings:      The perianal and digital  rectal examinations were normal.      A 4 mm polyp was found in the hepatic flexure. The polyp was sessile.       The polyp was removed with a cold snare. Resection and retrieval were   complete.      A 3 mm polyp was found in the hepatic flexure. The polyp was sessile.       The polyp was removed with a jumbo cold forceps. Resection and retrieval       were complete.      A 8 mm polyp was found in the recto-sigmoid colon. The polyp was       pedunculated. The polyp was removed with a hot snare. Resection and       retrieval were complete.      Three sessile polyps were found in the rectum. The polyps were 2 to 3 mm       in size. These polyps were removed with a jumbo cold forceps. Resection       and retrieval were complete.      Internal hemorrhoids were found during retroflexion. The hemorrhoids       were Grade I (internal hemorrhoids that do not prolapse).      A few small-mouthed diverticula were found in the sigmoid colon.      The exam was otherwise without abnormality. Impression:           - One 4 mm polyp at the hepatic flexure, removed with a                        cold snare. Resected and retrieved.                       - One 3 mm polyp at the hepatic flexure, removed with a                        jumbo cold forceps. Resected and retrieved.                       - One 8 mm polyp at the recto-sigmoid colon, removed                        with a hot snare. Resected and retrieved.                       - Three 2 to 3 mm polyps in the rectum, removed with a                        jumbo cold forceps. Resected and retrieved.                       - Internal hemorrhoids.                       - Diverticulosis in the sigmoid colon.                       - The examination was otherwise normal. Recommendation:       - Observe patient in GI recovery unit.                       - High fiber diet.                       -  Continue present medications.                       - Await pathology results.                       - Repeat colonoscopy for surveillance based on                        pathology results.                       - Return to referring physician.                        - The findings and recommendations were discussed with                        the patient.                       - The findings and recommendations were discussed with                        the patient's family. Procedure Code(s):    --- Professional ---                       [phone], Colonoscopy, flexible; with removal of tumor(s),                        polyp(s), or other lesion(s) by snare technique                       45380, 25, Colonoscopy, flexible; with biopsy, single                        or multiple Diagnosis Code(s):    --- Professional ---                       D12.3, Benign neoplasm of transverse colon (hepatic                        flexure or splenic flexure)                       D12.7, Benign neoplasm of rectosigmoid junction                       K62.1, Rectal polyp                       K64.0, First degree hemorrhoids                       R19.5, Other fecal abnormalities                       K57.30, Diverticulosis of large intestine without                        perforation or abscess without bleeding CPT copyright 2016 American Medical Association. All rights reserved. The codes documented in this report are preliminary and upon coder review may  be revised to meet current compliance requirements. Gerrit Heck  Rayann Heman, MD 06/25/2015 2:55:13 PM This report has been signed electronically. Number of Addenda: 0 Note Initiated On: 06/25/2015 2:28 PM Scope Withdrawal Time: 0 hours 14 minutes 35 seconds  Total Procedure Duration: 0 hours 17 minutes 53 seconds       [facility]

## 2015-06-25 NOTE — Discharge Instructions (Signed)

## 2015-06-25 NOTE — H&P (Signed)
  Primary Care Physician:  Dicky Doe, MD  Pre-Procedure History & Physical: HPI:  Derek Mayo is a 70 y.o. male is here for an colonoscopy.   Past Medical History  Diagnosis Date  . Chicken pox   . Mumps   . Measles   . Anemia   . Allergic rhinitis   . Premature beats   . COPD (chronic obstructive pulmonary disease) ([place])   . Depression   . Anxiety     Past Surgical History  Procedure Laterality Date  . Tonsilectomy/adenoidectomy with myringotomy    . Tonsillectomy    . Chest surgery    . Knee surgery Left     removed broken cartilage    Prior to Admission medications   Medication Sig Start Date End Date Taking? Authorizing Provider  buPROPion (WELLBUTRIN XL) 150 MG 24 hr tablet Take 1 tablet (150 mg total) by mouth daily. 05/14/15  Yes Arlis Porta., MD  fluticasone [facility]) 50 MCG/ACT nasal spray USE 2 SPRAYS IN [facility] NOSTRIL DAILY 04/19/15  Yes Arlis Porta., MD  fluticasone [facility]) 110 MCG/ACT inhaler INHALE 1 PUFF TWICE A DAY 09/20/14  Yes Historical Provider, MD  Umeclidinium Bromide (INCRUSE ELLIPTA) 62.5 MCG/INH AEPB INHALE 1 INHALATION INTO THE LUNGS ONCE DAILY. 10/17/14  Yes Historical Provider, MD  albuterol (VENTOLIN HFA) 108 (90 BASE) MCG/ACT inhaler INHALE 2 PUFFS 4 TIMES A DAY AS NEEDED 08/09/14   Historical Provider, MD  atorvastatin (LIPITOR) 10 MG tablet Take 1 tablet (10 mg total) by mouth daily. 11/13/14   Arlis Porta., MD    Allergies as of 06/18/2015  . (No Known Allergies)    Family History  Problem Relation Age of Onset  . Heart disease Father     Social History   Social History  . Marital Status: Married    Spouse Name: N/A  . Number of Children: N/A  . Years of Education: N/A   Occupational History  . Not on file.   Social History Main Topics  . Smoking status: Former Smoker    Types: Cigars    Quit date: 08/13/2012  . Smokeless tobacco: Never Used  . Alcohol Use: 0.6 oz/week    0 Standard drinks or  equivalent, 1 Cans of beer per week  . Drug Use: No     Comment: socially only  . Sexual Activity: Not on file   Other Topics Concern  . Not on file   Social History Narrative     Physical Exam: BP 150/82 mmHg  Pulse 83  Temp(Src) 97.8 F (36.6 C) (Tympanic)  Resp 16  Ht 5\' 7"  (1.702 m)  Wt 70.308 kg (155 lb)  BMI 24.27 kg/m2  SpO2 100% General:   Alert,  pleasant and cooperative in NAD Head:  Normocephalic and atraumatic. Neck:  Supple; no masses or thyromegaly. Lungs:  Clear throughout to auscultation.    Heart:  Regular rate and rhythm. Abdomen:  Soft, nontender and nondistended. Normal bowel sounds, without guarding, and without rebound.   Neurologic:  Alert and  oriented x4;  grossly normal neurologically.  Impression/Plan: [name] is here for an colonoscopy to be performed for + cologuard  Risks, benefits, limitations, and alternatives regarding  colonoscopy have been reviewed with the patient.  Questions have been answered.  All parties agreeable.   Josefine Class, MD  06/25/2015, 2:22 PM

## 2015-06-25 NOTE — Transfer of Care (Signed)
Immediate Anesthesia Transfer of Care Note  Patient: Derek Mayo  Procedure(s) Performed: Procedure(s): COLONOSCOPY WITH PROPOFOL (N/A)  Patient Location: PACU and Endoscopy Unit  Anesthesia Type:General  Level of Consciousness: awake and patient cooperative  Airway & Oxygen Therapy: Patient Spontanous Breathing and Patient connected to nasal cannula oxygen  Post-op Assessment: Report given to RN and Post -op Vital signs reviewed and stable  Post vital signs: Reviewed  Last Vitals:  Filed Vitals:   06/25/15 1246  BP: 150/82  Pulse: 83  Temp: 36.6 C  Resp: 16    Last Pain: There were no vitals filed for this visit.       Complications: No apparent anesthesia complications

## 2015-06-26 ENCOUNTER — Encounter: Payer: Self-pay | Admitting: Gastroenterology

## 2015-06-27 LAB — SURGICAL PATHOLOGY

## 2015-07-16 DIAGNOSIS — R69 Illness, unspecified: Secondary | ICD-10-CM | POA: Diagnosis not present

## 2015-08-11 DIAGNOSIS — R69 Illness, unspecified: Secondary | ICD-10-CM | POA: Diagnosis not present

## 2015-08-16 ENCOUNTER — Emergency Department: Payer: Medicare HMO

## 2015-08-16 ENCOUNTER — Emergency Department
Admission: EM | Admit: 2015-08-16 | Discharge: 2015-08-16 | Disposition: A | Discharge status: Home / Self Care | Payer: Medicare HMO | Attending: Emergency Medicine | Admitting: Emergency Medicine

## 2015-08-16 ENCOUNTER — Encounter: Payer: Self-pay | Admitting: *Deleted

## 2015-08-16 DIAGNOSIS — Z79899 Other long term (current) drug therapy: Secondary | ICD-10-CM | POA: Insufficient documentation

## 2015-08-16 DIAGNOSIS — S39012A Strain of muscle, fascia and tendon of lower back, initial encounter: Secondary | ICD-10-CM | POA: Diagnosis not present

## 2015-08-16 DIAGNOSIS — F329 Major depressive disorder, single episode, unspecified: Secondary | ICD-10-CM | POA: Diagnosis not present

## 2015-08-16 DIAGNOSIS — Z87891 Personal history of nicotine dependence: Secondary | ICD-10-CM | POA: Diagnosis not present

## 2015-08-16 DIAGNOSIS — M5416 Radiculopathy, lumbar region: Secondary | ICD-10-CM | POA: Diagnosis not present

## 2015-08-16 DIAGNOSIS — J449 Chronic obstructive pulmonary disease, unspecified: Secondary | ICD-10-CM | POA: Diagnosis not present

## 2015-08-16 DIAGNOSIS — Y929 Unspecified place or not applicable: Secondary | ICD-10-CM | POA: Diagnosis not present

## 2015-08-16 DIAGNOSIS — X501XXA Overexertion from prolonged static or awkward postures, initial encounter: Secondary | ICD-10-CM | POA: Diagnosis not present

## 2015-08-16 DIAGNOSIS — M545 Low back pain: Secondary | ICD-10-CM | POA: Diagnosis not present

## 2015-08-16 DIAGNOSIS — Y999 Unspecified external cause status: Secondary | ICD-10-CM | POA: Insufficient documentation

## 2015-08-16 DIAGNOSIS — Z7951 Long term (current) use of inhaled steroids: Secondary | ICD-10-CM | POA: Diagnosis not present

## 2015-08-16 DIAGNOSIS — Y9389 Activity, other specified: Secondary | ICD-10-CM | POA: Diagnosis not present

## 2015-08-16 DIAGNOSIS — M549 Dorsalgia, unspecified: Secondary | ICD-10-CM | POA: Diagnosis not present

## 2015-08-16 DIAGNOSIS — S3992XA Unspecified injury of lower back, initial encounter: Secondary | ICD-10-CM | POA: Diagnosis not present

## 2015-08-16 DIAGNOSIS — R69 Illness, unspecified: Secondary | ICD-10-CM | POA: Diagnosis not present

## 2015-08-16 MED ORDER — KETOROLAC TROMETHAMINE 30 MG/ML IJ SOLN
15.0000 mg | Freq: Once | INTRAMUSCULAR | Status: AC
  Administered 2015-08-16: 15 mg via INTRAVENOUS
  Filled 2015-08-16: qty 1

## 2015-08-16 MED ORDER — CYCLOBENZAPRINE HCL 5 MG PO TABS: 5.0000 mg | ORAL_TABLET | Freq: Three times a day (TID) | ORAL | Status: DC | PRN

## 2015-08-16 MED ORDER — CYCLOBENZAPRINE HCL 10 MG PO TABS
5.0000 mg | ORAL_TABLET | Freq: Once | ORAL | Status: AC
  Administered 2015-08-16: 5 mg via ORAL
  Filled 2015-08-16: qty 1

## 2015-08-16 NOTE — ED Provider Notes (Signed)
[facility] Emergency Department Provider Note ____________________________________________  Time seen: 1340  I have reviewed the triage vital signs and the nursing notes.  HISTORY  Chief Complaint  Back Pain  HPI Derek Mayo is a 70 y.o. male presents to the ED via EMS from home for evaluation of, now resolved, right lower extremity pain and spasm. The patient describesthe onset of pain about half an hour after he moved his small boat which was on its trailer to the truck hitch. He denies any injury, slipped, tripped,, or fall. He describes it about 9 AM he began to feel some slight locking and tightening to his lower back. The pain became so intense he was unable to stand or move to get out of the bathroom. He then began to notice some referred pain down the right thigh to the point of the knee but not beyond he noted spasm and pain to the right leg. He denies any fevers, chills, sweats, incontinence, foot drop, or leg weakness. He denies any significant history of lumbar back pain but does note some history of chronic but intermittent cervical pain. He describes getting 2 doses of IV medication from the EMS provider in route to the ED. He now reports his pain is essentially see well at the time of this interview.  Past Medical History  Diagnosis Date  . Chicken pox   . Mumps   . Measles   . Anemia   . Allergic rhinitis   . Premature beats   . COPD (chronic obstructive pulmonary disease) ([place])   . Depression   . Anxiety     Patient Active Problem List   Diagnosis Date Noted  . Colon cancer screening 05/09/2015  . Depression 03/19/2015  . Dyslipidemia 08/10/2014  . Allergic rhinitis, seasonal 06/10/2014  . Chronic airway obstruction ([place]) 06/10/2014  . Current smoker 06/10/2014  . Asthmatic breathing 06/10/2014  . COPD, severe ([place]) 11/08/2013    Past Surgical History  Procedure Laterality Date  . Tonsilectomy/adenoidectomy with myringotomy    .  Tonsillectomy    . Chest surgery    . Knee surgery Left     removed broken cartilage  . Colonoscopy with propofol N/A 06/25/2015    Procedure: COLONOSCOPY WITH PROPOFOL;  Surgeon: Josefine Class, MD;  Location: [facility] ENDOSCOPY;  Service: Endoscopy;  Laterality: N/A;    Current Outpatient Rx  Name  Route  Sig  Dispense  Refill  . albuterol (VENTOLIN HFA) 108 (90 BASE) MCG/ACT inhaler      INHALE 2 PUFFS 4 TIMES A DAY AS NEEDED         . atorvastatin (LIPITOR) 10 MG tablet   Oral   Take 1 tablet (10 mg total) by mouth daily.   90 tablet   3   . buPROPion (WELLBUTRIN XL) 150 MG 24 hr tablet   Oral   Take 1 tablet (150 mg total) by mouth daily.   90 tablet   4   . cyclobenzaprine (FLEXERIL) 5 MG tablet   Oral   Take 1 tablet (5 mg total) by mouth 3 (three) times daily as needed for muscle spasms.   15 tablet   0   . fluticasone (FLONASE) 50 MCG/ACT nasal spray      USE 2 SPRAYS IN EACH NOSTRIL DAILY   48 g   3   . fluticasone (FLOVENT HFA) 110 MCG/ACT inhaler      INHALE 1 PUFF TWICE A DAY         .  Umeclidinium Bromide (INCRUSE ELLIPTA) 62.5 MCG/INH AEPB      INHALE 1 INHALATION INTO THE LUNGS ONCE DAILY.           Allergies Review of patient's allergies indicates no known allergies.  Family History  Problem Relation Age of Onset  . Heart disease Father     Social History Social History  Substance Use Topics  . Smoking status: Former Smoker    Types: Cigars    Quit date: 08/13/2012  . Smokeless tobacco: Never Used  . Alcohol Use: 0.6 oz/week    1 Cans of beer, 0 Standard drinks or equivalent per week    Review of Systems  Constitutional: Negative for fever. Cardiovascular: Negative for chest pain. Respiratory: Negative for shortness of breath. Gastrointestinal: Negative for abdominal pain, vomiting and diarrhea. Genitourinary: Negative for dysuria. Musculoskeletal: Positive for back pain. Skin: Negative for rash. Neurological: Negative  for headaches, focal weakness or numbness. ____________________________________________  PHYSICAL EXAM:  VITAL SIGNS: ED Triage Vitals  Enc Vitals Group     BP --      Pulse --      Resp --      Temp --      Temp src --      SpO2 --      Weight 08/16/15 1231 160 lb (72.576 kg)     Height 08/16/15 1231 5\' 7"  (1.702 m)     Head Cir --      Peak Flow --      Pain Score 08/16/15 1231 10     Pain Loc --      Pain Edu? --      Excl. in [place]? --    Constitutional: Alert and oriented. Well appearing and in no distress. Head: Normocephalic and atraumatic. Cardiovascular: Normal rate, regular rhythm.  Respiratory: Normal respiratory effort. No wheezes/rales/rhonchi. Gastrointestinal: Soft and nontender. No distention. Musculoskeletal: Normal spinal alignment without midline tenderness, spasm, deformity, or step-off. Patient with minimal tenderness to palpation over the right lumbar sacral region. He has resolved referred pain to the right lower extremity. He is able to demonstrate a negative seated straight leg raise on exam. Normal hip abduction and abduction noted. Nontender with normal range of motion in all extremities.  Neurologic: Cranial nerves II through XII grossly intact. Normal LE DTRs bilaterally. Mildly antalgic gait without ataxia. Normal speech and language. No gross focal neurologic deficits are appreciated. Skin:  Skin is warm, dry and intact. No rash noted. ____________________________________________   RADIOLOGY  Lumbar Spine IMPRESSION: Age-indeterminate height loss of the anterior aspect of the T12 and L2 vertebral bodies. Recommend correlation for point tenderness.  Degenerative disc disease.   I, Shena Vinluan, Dannielle Karvonen, personally viewed and evaluated these images (plain radiographs) as part of my medical decision making, as well as reviewing the written report by the radiologist. ____________________________________________  PROCEDURES  Toradol 15 mg  IVP Flexeril 5 mg PO ____________________________________________  INITIAL IMPRESSION / ASSESSMENT AND PLAN / ED COURSE  Patient with acute low back pain with referral to the right lower extremity following a mechanical injury. He responded well to the IV fentanyl given in route from home. He reports now some recurrence of the pain to the low back and he rates at about a 4 out of 10 at the time of reassessment and x-ray review. He will be dosed with IV Toradol and by mouth Flexeril prior to discontinuation of his IV lock. He will be discharged with a prescription for Flexeril to dose  in addition to over-the-counter Naprosyn. He will follow-up with Dr. Luan Pulling for ongoing symptom management. Return precautions are reviewed. ____________________________________________  FINAL CLINICAL IMPRESSION(S) / ED DIAGNOSES  Final diagnoses:  Lumbar strain, initial encounter  Acute right lumbar radiculopathy     Melvenia Needles, PA-C 08/16/15 1524  Daymon Larsen, MD 08/16/15 [phone]

## 2015-08-16 NOTE — ED Notes (Addendum)
Pt has chronic back pain, pt was moving his boat and felt a pull in is back, pain reports pain is radiating down leg, pt brought in by [facility], pt was given 73mcg of Fentanyl in route

## 2015-08-16 NOTE — Discharge Instructions (Signed)
Lumbosacral Radiculopathy Lumbosacral radiculopathy is a condition that involves the spinal nerves and nerve roots in the low back and bottom of the spine. The condition develops when these nerves and nerve roots move out of place or become inflamed and cause symptoms. CAUSES This condition may be caused by:  Pressure from a disk that bulges out of place (herniated disk). A disk is a plate of cartilage that separates bones in the spine.  Disk degeneration.  A narrowing of the bones of the lower back (spinal stenosis).  A tumor.  An infection.  An injury that places sudden pressure on the disks that cushion the bones of your lower spine. RISK FACTORS This condition is more likely to develop in:  Males aged 30-50 years.  Females aged 18-60 years.  People who lift improperly.  People who are overweight or live a sedentary lifestyle.  People who smoke.  People who perform repetitive activities that strain the spine. SYMPTOMS Symptoms of this condition include:  Pain that goes down from the back into the legs (sciatica). This is the most common symptom. The pain may be worse with sitting, coughing, or sneezing.  Pain and numbness in the arms and legs.  Muscle weakness.  Tingling.  Loss of bladder control or bowel control. DIAGNOSIS This condition is diagnosed with a physical exam and medical history. If the pain is lasting, you may have tests, such as:  MRI scan.  X-ray.  CT scan.  Myelogram.  Nerve conduction study. TREATMENT This condition is often treated with:  Hot packs and ice applied to affected areas.  Stretches to improve flexibility.  Exercises to strengthen back muscles.  Physical therapy.  Pain medicine.  A steroid injection in the spine. In some cases, no treatment is needed. If the condition is long-lasting (chronic), or if symptoms are severe, treatment may involve surgery or lifestyle changes, such as following a weight loss plan. HOME  CARE INSTRUCTIONS Medicines  Take medicines only as directed by your health care provider.  Do not drive or operate heavy machinery while taking pain medicine. Injury Care  Apply a heat pack to the injured area as directed by your health care provider.  Apply ice to the affected area:  Put ice in a plastic bag.  Place a towel between your skin and the bag.  Leave the ice on for 20-30 minutes, every 2 hours while you are awake or as needed. Or, leave the ice on for as long as directed by your health care provider. Other Instructions  If you were shown how to do any exercises or stretches, do them as directed by your health care provider.  If your health care provider prescribed a diet or exercise program, follow it as directed.  Keep all follow-up visits as directed by your health care provider. This is important. SEEK MEDICAL CARE IF:  Your pain does not improve over time even when taking pain medicines. SEEK IMMEDIATE MEDICAL CARE IF:  Your develop severe pain.  Your pain suddenly gets worse.  You develop increasing weakness in your legs.  You lose the ability to control your bladder or bowel.  You have difficulty walking or balancing.  You have a fever.   This information is not intended to replace advice given to you by your health care provider. Make sure you discuss any questions you have with your health care provider.   Document Released: 02/10/2005 Document Revised: 06/27/2014 Document Reviewed: 02/06/2014 Elsevier Interactive Patient Education [employer].  Lumbosacral  Strain Lumbosacral strain is a strain of any of the parts that make up your lumbosacral vertebrae. Your lumbosacral vertebrae are the bones that make up the lower third of your backbone. Your lumbosacral vertebrae are held together by muscles and tough, fibrous tissue (ligaments).  CAUSES  A sudden blow to your back can cause lumbosacral strain. Also, anything that causes an excessive  stretch of the muscles in the low back can cause this strain. This is typically seen when people exert themselves strenuously, fall, lift heavy objects, bend, or crouch repeatedly. RISK FACTORS  Physically demanding work.  Participation in pushing or pulling sports or sports that require a sudden twist of the back (tennis, golf, baseball).  Weight lifting.  Excessive lower back curvature.  Forward-tilted pelvis.  Weak back or abdominal muscles or both.  Tight hamstrings. SIGNS AND SYMPTOMS  Lumbosacral strain may cause pain in the area of your injury or pain that moves (radiates) down your leg.  DIAGNOSIS Your health care provider can often diagnose lumbosacral strain through a physical exam. In some cases, you may need tests such as X-ray exams.  TREATMENT  Treatment for your lower back injury depends on many factors that your clinician will have to evaluate. However, most treatment will include the use of anti-inflammatory medicines. HOME CARE INSTRUCTIONS   Avoid hard physical activities (tennis, racquetball, waterskiing) if you are not in proper physical condition for it. This may aggravate or create problems.  If you have a back problem, avoid sports requiring sudden body movements. Swimming and walking are generally safer activities.  Maintain good posture.  Maintain a healthy weight.  For acute conditions, you may put ice on the injured area.  Put ice in a plastic bag.  Place a towel between your skin and the bag.  Leave the ice on for 20 minutes, 2-3 times a day.  When the low back starts healing, stretching and strengthening exercises may be recommended. SEEK MEDICAL CARE IF:  Your back pain is getting worse.  You experience severe back pain not relieved with medicines. SEEK IMMEDIATE MEDICAL CARE IF:   You have numbness, tingling, weakness, or problems with the use of your arms or legs.  There is a change in bowel or bladder control.  You have increasing  pain in any area of the body, including your belly (abdomen).  You notice shortness of breath, dizziness, or feel faint.  You feel sick to your stomach (nauseous), are throwing up (vomiting), or become sweaty.  You notice discoloration of your toes or legs, or your feet get very cold. MAKE SURE YOU:   Understand these instructions.  Will watch your condition.  Will get help right away if you are not doing well or get worse.   This information is not intended to replace advice given to you by your health care provider. Make sure you discuss any questions you have with your health care provider.   Document Released: 11/20/2004 Document Revised: 03/03/2014 Document Reviewed: 09/29/2012 Elsevier Interactive Patient Education 2016 [place] the prescription meds as directed. Follow-up with Dr. Luan Pulling if symptoms worsen. Return to the ED for symptoms as discussed.

## 2015-11-04 ENCOUNTER — Other Ambulatory Visit: Payer: Self-pay | Admitting: Family Medicine

## 2015-11-04 DIAGNOSIS — E785 Hyperlipidemia, unspecified: Secondary | ICD-10-CM

## 2015-11-12 ENCOUNTER — Encounter: Payer: Self-pay | Admitting: Family Medicine

## 2015-11-12 ENCOUNTER — Ambulatory Visit (INDEPENDENT_AMBULATORY_CARE_PROVIDER_SITE_OTHER): Payer: Medicare HMO | Admitting: Family Medicine

## 2015-11-12 VITALS — BP 136/84 | HR 89 | Temp 97.6°F | Resp 16 | Ht 67.0 in | Wt 167.0 lb

## 2015-11-12 DIAGNOSIS — J3489 Other specified disorders of nose and nasal sinuses: Secondary | ICD-10-CM | POA: Diagnosis not present

## 2015-11-12 DIAGNOSIS — H04209 Unspecified epiphora, unspecified lacrimal gland: Secondary | ICD-10-CM | POA: Diagnosis not present

## 2015-11-12 DIAGNOSIS — Z23 Encounter for immunization: Secondary | ICD-10-CM | POA: Diagnosis not present

## 2015-11-12 DIAGNOSIS — R0981 Nasal congestion: Secondary | ICD-10-CM | POA: Diagnosis not present

## 2015-11-12 NOTE — Progress Notes (Signed)
Name: Derek Mayo   MRN: [id number]    DOB: 1945-09-16   Date:11/12/2015       Progress Note  Subjective  Chief Complaint  Chief Complaint  Patient presents with  . COPD  . Depression    HPI Here for f/u of COPD.  O2 levels of 95-97 on arising in AM.  His spirnometry is worse when he has all of his nasal congestion.  He states that his worse problem is chronic nasal congestion, esp on the R.  Nothing helps him get this better.  Uses Flonase daily and has uses antihistamines without help.  This has gone on for months and months.  Also has chronic R eye drainage, esp with the nasal congestion.  Nebulizer with albuterol and saline and eucolyptus.  He stopped smoking 3 year ago.  No problem-specific Assessment & Plan notes found for this encounter.   Past Medical History:  Diagnosis Date  . Allergic rhinitis   . Anemia   . Anxiety   . Chicken pox   . COPD (chronic obstructive pulmonary disease) ([place])   . Depression   . Measles   . Mumps   . Premature beats     Past Surgical History:  Procedure Laterality Date  . CHEST SURGERY    . COLONOSCOPY WITH PROPOFOL N/A 06/25/2015   Procedure: COLONOSCOPY WITH PROPOFOL;  Surgeon: Josefine Class, MD;  Location: [facility] ENDOSCOPY;  Service: Endoscopy;  Laterality: N/A;  . KNEE SURGERY Left    removed broken cartilage  . TONSILECTOMY/ADENOIDECTOMY WITH MYRINGOTOMY    . TONSILLECTOMY      Family History  Problem Relation Age of Onset  . Heart disease Father     Social History   Social History  . Marital status: Married    Spouse name: N/A  . Number of children: N/A  . Years of education: N/A   Occupational History  . Not on file.   Social History Main Topics  . Smoking status: Former Smoker    Types: Cigars    Quit date: 08/13/2012  . Smokeless tobacco: Never Used  . Alcohol use 0.6 oz/week    1 Cans of beer per week  . Drug use:     Types: Other-see comments     Comment: socially only  . Sexual activity: Not on  file   Other Topics Concern  . Not on file   Social History Narrative  . No narrative on file     Current Outpatient Prescriptions:  .  albuterol (VENTOLIN HFA) 108 (90 BASE) MCG/ACT inhaler, INHALE 2 PUFFS 4 TIMES A DAY AS NEEDED, Disp: , Rfl:  .  atorvastatin (LIPITOR) 10 MG tablet, TAKE 1 TABLET (10 MG TOTAL) BY MOUTH DAILY., Disp: 90 tablet, Rfl: 3 .  fluticasone (FLONASE) 50 MCG/ACT nasal spray, USE 2 SPRAYS IN EACH NOSTRIL DAILY, Disp: 48 g, Rfl: 3 .  fluticasone (FLOVENT HFA) 110 MCG/ACT inhaler, INHALE 1 PUFF TWICE A DAY, Disp: , Rfl:  .  Umeclidinium Bromide (INCRUSE ELLIPTA) 62.5 MCG/INH AEPB, INHALE 1 INHALATION INTO THE LUNGS ONCE DAILY., Disp: , Rfl:  .  albuterol (PROVENTIL) (2.5 MG/3ML) 0.083% nebulizer solution, Inhale 0.083 mLs into the lungs every other day., Disp: , Rfl:   Not on File   Review of Systems  Constitutional: Negative for chills, fever, malaise/fatigue and weight loss.  HENT: Positive for congestion. Negative for hearing loss.   Eyes: Positive for discharge (watery). Negative for blurred vision and double vision.  Respiratory: Negative for cough,  shortness of breath and wheezing.   Cardiovascular: Negative for chest pain, palpitations and leg swelling.  Gastrointestinal: Negative for abdominal pain, blood in stool and heartburn.  Genitourinary: Negative for dysuria, frequency and urgency.  Musculoskeletal: Negative for joint pain and myalgias.  Skin: Negative for rash.  Neurological: Negative for dizziness, tremors, weakness and headaches.      Objective  Vitals:   11/12/15 0931  BP: 136/84  Pulse: 89  Resp: 16  Temp: 97.6 F (36.4 C)  TempSrc: Oral  Weight: 167 lb (75.8 kg)  Height: 5\' 7"  (1.702 m)    Physical Exam  Constitutional: He is oriented to person, place, and time and well-developed, well-nourished, and in no distress. No distress.  HENT:  Head: Normocephalic and atraumatic.  Nose: Mucosal edema and rhinorrhea present.   Eyes: EOM are normal. Pupils are equal, round, and reactive to light. No scleral icterus.  R conjunctiva sl. Red.  Some extra watering of R eye.  Neck: Normal range of motion. Neck supple. No thyromegaly present.  Cardiovascular: Normal rate, regular rhythm and normal heart sounds.  Exam reveals no gallop and no friction rub.   No murmur heard. Pulmonary/Chest: Effort normal and breath sounds normal. No respiratory distress. He has no wheezes. He has no rales.  Abdominal: Soft. Bowel sounds are normal. He exhibits no distension and no mass. There is no tenderness.  Small abdominal wall hernia  Musculoskeletal: He exhibits no edema.  Lymphadenopathy:    He has no cervical adenopathy.  Neurological: He is alert and oriented to person, place, and time.  Psychiatric:  Affect is ok off Welbutrin.   Welbutrin caused urinary retention  Vitals reviewed.      No results found for this or any previous visit (from the past 2160 hour(s)).   Assessment & Plan  Problem List Items Addressed This Visit      Other   Nasal congestion   Relevant Orders   Ambulatory referral to ENT   Nasal discharge with watery eyes   Relevant Orders   Ambulatory referral to Ophthalmology    Other Visit Diagnoses    Immunization due    -  Primary   Relevant Orders   Flu vaccine HIGH DOSE PF (Fluzone High dose)      Meds ordered this encounter  Medications  . albuterol (PROVENTIL) (2.5 MG/3ML) 0.083% nebulizer solution    Sig: Inhale 0.083 mLs into the lungs every other day.   1. Nasal congestion  - Ambulatory referral to ENT  2. Nasal discharge with watery eyes  - Ambulatory referral to Ophthalmology  3. Immunization due  - Flu vaccine HIGH DOSE PF (Fluzone High dose)

## 2015-11-14 DIAGNOSIS — R69 Illness, unspecified: Secondary | ICD-10-CM | POA: Diagnosis not present

## 2015-11-14 DIAGNOSIS — J449 Chronic obstructive pulmonary disease, unspecified: Secondary | ICD-10-CM | POA: Diagnosis not present

## 2015-12-03 DIAGNOSIS — H04209 Unspecified epiphora, unspecified lacrimal gland: Secondary | ICD-10-CM | POA: Diagnosis not present

## 2015-12-03 DIAGNOSIS — J310 Chronic rhinitis: Secondary | ICD-10-CM | POA: Diagnosis not present

## 2015-12-03 DIAGNOSIS — J342 Deviated nasal septum: Secondary | ICD-10-CM | POA: Diagnosis not present

## 2015-12-14 DIAGNOSIS — M3501 Sicca syndrome with keratoconjunctivitis: Secondary | ICD-10-CM | POA: Diagnosis not present

## 2016-02-11 ENCOUNTER — Encounter: Payer: Self-pay | Admitting: *Deleted

## 2016-02-11 ENCOUNTER — Encounter: Payer: Self-pay | Admitting: Family Medicine

## 2016-02-11 ENCOUNTER — Ambulatory Visit (INDEPENDENT_AMBULATORY_CARE_PROVIDER_SITE_OTHER): Payer: Medicare HMO | Admitting: Family Medicine

## 2016-02-11 VITALS — Resp 16 | Ht 67.0 in | Wt 168.0 lb

## 2016-02-11 DIAGNOSIS — R0981 Nasal congestion: Secondary | ICD-10-CM | POA: Diagnosis not present

## 2016-02-11 DIAGNOSIS — J302 Other seasonal allergic rhinitis: Secondary | ICD-10-CM

## 2016-02-11 DIAGNOSIS — J431 Panlobular emphysema: Secondary | ICD-10-CM | POA: Diagnosis not present

## 2016-02-11 MED ORDER — MONTELUKAST SODIUM 10 MG PO TABS: 10.0000 mg | ORAL_TABLET | Freq: Every day | ORAL | 3 refills | Status: DC

## 2016-02-11 NOTE — Progress Notes (Signed)
Name: Derek Mayo   MRN: [id number]    DOB: 1945/07/06   Date:02/11/2016       Progress Note  Subjective  Chief Complaint  No chief complaint on file.   HPI Here for f/u of allergies.  He still has nasal drainage and drippy watery eyes.  He gets some relief with standing in a hot shower. No problem-specific Assessment & Plan notes found for this encounter.   Past Medical History:  Diagnosis Date  . Allergic rhinitis   . Anemia   . Anxiety   . Chicken pox   . COPD (chronic obstructive pulmonary disease) ([place])   . Depression   . Measles   . Mumps   . Premature beats     Past Surgical History:  Procedure Laterality Date  . CHEST SURGERY    . COLONOSCOPY WITH PROPOFOL N/A 06/25/2015   Procedure: COLONOSCOPY WITH PROPOFOL;  Surgeon: Josefine Class, MD;  Location: [facility] ENDOSCOPY;  Service: Endoscopy;  Laterality: N/A;  . KNEE SURGERY Left    removed broken cartilage  . TONSILECTOMY/ADENOIDECTOMY WITH MYRINGOTOMY    . TONSILLECTOMY      Family History  Problem Relation Age of Onset  . Heart disease Father     Social History   Social History  . Marital status: Married    Spouse name: N/A  . Number of children: N/A  . Years of education: N/A   Occupational History  . Not on file.   Social History Main Topics  . Smoking status: Former Smoker    Types: Cigars    Quit date: 08/13/2012  . Smokeless tobacco: Never Used  . Alcohol use 0.6 oz/week    1 Cans of beer per week  . Drug use:     Types: Other-see comments     Comment: socially only  . Sexual activity: Not on file   Other Topics Concern  . Not on file   Social History Narrative  . No narrative on file     Current Outpatient Prescriptions:  .  albuterol (PROVENTIL) (2.5 MG/3ML) 0.083% nebulizer solution, Inhale 0.083 mLs into the lungs every other day., Disp: , Rfl:  .  albuterol (VENTOLIN HFA) 108 (90 BASE) MCG/ACT inhaler, INHALE 2 PUFFS 4 TIMES A DAY AS NEEDED, Disp: , Rfl:  .   atorvastatin (LIPITOR) 10 MG tablet, TAKE 1 TABLET (10 MG TOTAL) BY MOUTH DAILY., Disp: 90 tablet, Rfl: 3 .  Azelastine HCl 0.15 % SOLN, Place 1 spray into both nostrils daily as needed., Disp: , Rfl:  .  fluticasone (FLONASE) 50 MCG/ACT nasal spray, USE 2 SPRAYS IN EACH NOSTRIL DAILY, Disp: 48 g, Rfl: 3 .  fluticasone (FLOVENT HFA) 110 MCG/ACT inhaler, INHALE 1 PUFF TWICE A DAY, Disp: , Rfl:  .  ipratropium (ATROVENT) 0.03 % nasal spray, Place 1 spray into both nostrils daily as needed., Disp: , Rfl:  .  PAZEO 0.7 % SOLN, Place 1 drop into both eyes daily as needed., Disp: , Rfl:  .  Umeclidinium Bromide (INCRUSE ELLIPTA) 62.5 MCG/INH AEPB, INHALE 1 INHALATION INTO THE LUNGS ONCE DAILY., Disp: , Rfl:  .  montelukast (SINGULAIR) 10 MG tablet, Take 1 tablet (10 mg total) by mouth at bedtime., Disp: 30 tablet, Rfl: 3  Not on File   Review of Systems  Constitutional: Negative for chills, fever, malaise/fatigue and weight loss.  HENT: Positive for congestion. Negative for hearing loss and tinnitus.        Post nasal drainage  Eyes: Negative  for blurred vision and double vision.  Respiratory: Negative for cough, sputum production, shortness of breath and wheezing.   Cardiovascular: Negative for chest pain, palpitations and leg swelling.  Gastrointestinal: Negative for abdominal pain, blood in stool and heartburn.  Genitourinary: Negative for dysuria, frequency and urgency.  Skin: Negative for rash.  Neurological: Negative for dizziness, tingling, tremors, weakness and headaches.      Objective  Vitals:   02/11/16 0923  Resp: 16  Weight: 168 lb (76.2 kg)  Height: 5\' 7"  (1.702 m)    Physical Exam  Constitutional: He is oriented to person, place, and time and well-developed, well-nourished, and in no distress. No distress.  HENT:  Head: Normocephalic and atraumatic.  Right Ear: External ear normal.  Left Ear: External ear normal.  Nose: No mucosal edema or rhinorrhea.   Mouth/Throat: Oropharynx is clear and moist.  Eyes: Conjunctivae and EOM are normal. Pupils are equal, round, and reactive to light. No scleral icterus.  Neck: Normal range of motion. Neck supple. Carotid bruit is not present. No thyromegaly present.  Cardiovascular: Normal rate, regular rhythm and normal heart sounds.  Exam reveals no gallop and no friction rub.   No murmur heard. Pulmonary/Chest: Effort normal and breath sounds normal. No respiratory distress. He has no wheezes. He has no rales.  Musculoskeletal: He exhibits no edema.  Lymphadenopathy:    He has no cervical adenopathy.  Neurological: He is alert and oriented to person, place, and time.  Vitals reviewed.      No results found for this or any previous visit (from the past 2160 hour(s)).   Assessment & Plan  Problem List Items Addressed This Visit      Respiratory   Allergic rhinitis, seasonal - Primary   Relevant Medications   montelukast (SINGULAIR) 10 MG tablet   Chronic airway obstruction (HCC)   Relevant Medications   Azelastine HCl 0.15 % SOLN   ipratropium (ATROVENT) 0.03 % nasal spray   montelukast (SINGULAIR) 10 MG tablet     Other   Nasal congestion      Meds ordered this encounter  Medications  . Azelastine HCl 0.15 % SOLN    Sig: Place 1 spray into both nostrils daily as needed.  Marland Kitchen PAZEO 0.7 % SOLN    Sig: Place 1 drop into both eyes daily as needed.  Marland Kitchen ipratropium (ATROVENT) 0.03 % nasal spray    Sig: Place 1 spray into both nostrils daily as needed.  . montelukast (SINGULAIR) 10 MG tablet    Sig: Take 1 tablet (10 mg total) by mouth at bedtime.    Dispense:  30 tablet    Refill:  3   1. Chronic seasonal allergic rhinitis, unspecified trigger  - montelukast (SINGULAIR) 10 MG tablet; Take 1 tablet (10 mg total) by mouth at bedtime.  Dispense: 30 tablet; Refill: 3 Cont meds 2. Panlobular emphysema (HCC) Cont meds  3. Nasal congestion

## 2016-05-21 DIAGNOSIS — R0609 Other forms of dyspnea: Secondary | ICD-10-CM | POA: Diagnosis not present

## 2016-05-21 DIAGNOSIS — J449 Chronic obstructive pulmonary disease, unspecified: Secondary | ICD-10-CM | POA: Diagnosis not present

## 2016-06-09 ENCOUNTER — Telehealth: Payer: Self-pay | Admitting: Family Medicine

## 2016-06-09 MED ORDER — FLUTICASONE PROPIONATE 50 MCG/ACT NA SUSP: 2.0000 | Freq: Every day | NASAL | 3 refills | Status: DC

## 2016-06-09 NOTE — Telephone Encounter (Signed)
Pt.is requesting a refill on  fluticasone

## 2016-08-19 ENCOUNTER — Ambulatory Visit (INDEPENDENT_AMBULATORY_CARE_PROVIDER_SITE_OTHER): Payer: Medicare HMO | Admitting: Family Medicine

## 2016-08-19 ENCOUNTER — Other Ambulatory Visit: Payer: Self-pay

## 2016-08-19 ENCOUNTER — Encounter: Payer: Self-pay | Admitting: Family Medicine

## 2016-08-19 VITALS — BP 117/60 | HR 88 | Ht 67.0 in | Wt 172.0 lb

## 2016-08-19 DIAGNOSIS — F32A Depression, unspecified: Secondary | ICD-10-CM

## 2016-08-19 DIAGNOSIS — Z136 Encounter for screening for cardiovascular disorders: Secondary | ICD-10-CM | POA: Diagnosis not present

## 2016-08-19 DIAGNOSIS — Z125 Encounter for screening for malignant neoplasm of prostate: Secondary | ICD-10-CM | POA: Diagnosis not present

## 2016-08-19 DIAGNOSIS — R799 Abnormal finding of blood chemistry, unspecified: Secondary | ICD-10-CM

## 2016-08-19 DIAGNOSIS — Z1211 Encounter for screening for malignant neoplasm of colon: Secondary | ICD-10-CM | POA: Diagnosis not present

## 2016-08-19 DIAGNOSIS — J449 Chronic obstructive pulmonary disease, unspecified: Secondary | ICD-10-CM | POA: Diagnosis not present

## 2016-08-19 DIAGNOSIS — M069 Rheumatoid arthritis, unspecified: Secondary | ICD-10-CM | POA: Diagnosis not present

## 2016-08-19 DIAGNOSIS — E785 Hyperlipidemia, unspecified: Secondary | ICD-10-CM

## 2016-08-19 DIAGNOSIS — F329 Major depressive disorder, single episode, unspecified: Secondary | ICD-10-CM | POA: Diagnosis not present

## 2016-08-19 DIAGNOSIS — Z1159 Encounter for screening for other viral diseases: Secondary | ICD-10-CM | POA: Diagnosis not present

## 2016-08-19 DIAGNOSIS — R69 Illness, unspecified: Secondary | ICD-10-CM | POA: Diagnosis not present

## 2016-08-19 MED ORDER — DICLOFENAC SODIUM 1 % TD GEL: 2.0000 g | Freq: Three times a day (TID) | TRANSDERMAL | 2 refills | Status: DC | PRN

## 2016-08-19 NOTE — Patient Instructions (Addendum)
Thank you for coming to the clinic today.   1. Remain off Atorvastatin for now 2. We will check labs in 6 months, fasting. If cholesterol is abnormal or risk is elevated, will recommend may resume either half dose or whole dose Atorvastatin once weekly, and gradually increase dose up to max of every other day as tolerated - OR we can switch to a different Statin option such as Crestor or Rosuvastatin 3. Continue Aspirin 325mg  daily 4. Sent new rx to [facility] for Diclofenac topical NSAID gel - use as prescribed, if we need to get extra authorization we will, stay tuned. Lastly, if not covered, I would plan on sending new rx Meloxicam 7.5 or 15mg  daily if on this medicine you should STOP Naproxen, Aleve, Ibuprofen.  Recommend to start taking Tylenol Extra Strength 500mg  tabs - take 1 to 2 tabs per dose (max 1000mg ) every 6-8 hours for pain (take regularly, don't skip a dose for next 7 days), max 24 hour daily dose is 6 tablets or 3000mg . In the future you can repeat the same everyday Tylenol course for 1-2 weeks at a time.   You may continue Naproxen / ALeve as you are taking it currently.  Ordered Abdominal Aortic Aneurysm Ultrasound screening - you will be contacted about an appointment about this imaging test.  In future consider peripheral artery disease screening.  You will be due for FASTING BLOOD WORK (no food or drink after midnight before, only water or coffee without cream/sugar on the morning of)  - Please go ahead and schedule a "Lab Only" visit in the morning at the clinic for lab draw in 6 months  For Lab Results, once available within 2-3 days of blood draw, you can can log in to MyChart online to view your results and a brief explanation. Also, we can discuss results at next follow-up visit.  Please schedule a Follow-up Appointment to: Return in about 6 months (around 02/18/2017) for CPE Medicare.  If you have any other questions or concerns, please feel free to call the clinic or  send a message through [place]. You may also schedule an earlier appointment if necessary.  Additionally, you may be receiving a survey about your experience at our clinic within a few days to 1 week by e-mail or mail. We value your feedback.  Nobie Putnam, DO [place]

## 2016-08-19 NOTE — Assessment & Plan Note (Addendum)
Controlled cholesterol in past off statin. Was on Atorv due to ASCVD risk Last lipid panel 2016  Plan: 1. Continue current meds - Fish Oil, Vitamins 2. Continue ASA 325mg  daily (advised may reduce to 81mg ) for primary ASCVD risk reduction 3. Encourage improved lifestyle - low carb/cholesterol, reduce portion size, continue improving regular exercise 4. Follow-up 6 months for CPE with fasting labs - re-consider statin in future, would do low dose rosuvastatin intermittent dosing only

## 2016-08-19 NOTE — Assessment & Plan Note (Signed)
Due for [place] screening based on risk factors, former smoker Orders placed, to be scheduled

## 2016-08-19 NOTE — Assessment & Plan Note (Addendum)
Stable severe COPD emphysema Followed by [facility] Dr Raul Del, monitoring PFTs Continues on Incruse Ellipta No recent flare Former smoker Follow-up as planned

## 2016-08-19 NOTE — Progress Notes (Signed)
Subjective:    Patient ID: Derek Mayo, male    DOB: 11/26/1945, 71 y.o.   MRN: [id number]  Derek Mayo is a 71 y.o. 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 Several years ago he was evaluated by prior PCP Dr Ellene Route and had problem initially pneumonia and bronchitis, ultimately found to have COPD on pulm function testing. Prior history of being very athletic up to 102 miles a day on bicycle - Currently he is doing well, continues to take all medicines - Continue to see Dr Raul Del, next September, does pulmonary function testing routinely - Denies recent exacerbation, dyspnea, cough, sputum  HYPERLIPIDEMIA: - Reports concerns with possible statin induced myalgia, describes he has been on Atorvastatin 10mg  (for only ASCVD risk reduction) for past 2 years ago, has had intermittent problems with muscle cramping and pain, he recently stopped this about 6 weeks ago and has done much better and overall symptom free - Takes mustard PRN for cramps, now still has occasional cramp but no further myalgias. He is asking about checking CK and Liver enzymes - Last lipid panel 10/2014 was normal but this was before he started any statin medication - Taking daily Vitamin B complex, Fish Oil.  RA / OA/DJD - Reports chronic history of RA and arthritis in both hand joints multiple fingers and thumbs, chronic problem has family history, also history of working as Archivist. He has done some ukelele and keyboard playing at senior center to keep hands and fingers loose. - Takes Naproxen 250mg  once daily 3-4 pills weekly. Also has tried Ibuprofen - Previously taking Tylenol Ext Str 500mg  1-2 times daily, not regularly - Has not tried topical NSAID before  Health Maintenance: - Last colonoscopy 06/25/2015, per Dr Rayann Heman, reported to have  x 8 polyps, reportedly pathology was benign and non cancerous. He is asking about when to return either 3-5 years and other screening options. Prior history of Cologuard 04/28/15 - Prior PCP Dr Luan Pulling continue with vitamins daily - No family history of prostate cancer. Interested in [place] - Interested in ordering Abdominal US for AAA screening, in male with smoking history  History of Depression: - >1-2 years ago was on Wellbutrin due to concern with depression, has been off since and doing well, no longer on medication.  Depression screen [facility] 2/9 08/19/2016 02/11/2016 11/12/2015  Decreased Interest 0 0 1  Down, Depressed, Hopeless 0 0 2  PHQ - 2 Score 0 0 3  Altered sleeping 0 - 0  Tired, decreased energy 0 - 2  Change in appetite 0 - 0  Feeling bad or failure about yourself  0 - 3  Trouble concentrating 0 - 0  Moving slowly or fidgety/restless 0 - 0  Suicidal thoughts 0 - 0  PHQ-9 Score 0 - 8  Difficult doing work/chores - - Somewhat difficult    Social History  Substance Use Topics  . Smoking status: Former Smoker    Packs/day: 1.00    Years: 40.00    Types: Cigars, Cigarettes    Quit date: 08/13/2012  . Smokeless tobacco: Never Used  . Alcohol use 0.6 oz/week    1 Cans of beer per week    Review of Systems Per HPI unless specifically indicated above     Objective:    BP 117/60 (BP Location: Right  Arm, Patient Position: Sitting, Cuff Size: Normal)   Pulse 88   Ht 5\' 7"  (1.702 m)   Wt 172 lb (78 kg)   SpO2 97%   BMI 26.94 kg/m   Wt Readings from Last 3 Encounters:  08/19/16 172 lb (78 kg)  02/11/16 168 lb (76.2 kg)  11/12/15 167 lb (75.8 kg)    Physical Exam  Constitutional: He is oriented to person, place, and time. He appears well-developed and well-nourished. No distress.  Well-appearing, comfortable, cooperative  HENT:  Head: Normocephalic and atraumatic.  Mouth/Throat: Oropharynx is clear and moist.  Eyes: Conjunctivae are normal. Right eye exhibits no  discharge. Left eye exhibits no discharge.  Neck: Normal range of motion. Neck supple. No thyromegaly present.  Cardiovascular: Normal rate, regular rhythm, normal heart sounds and intact distal pulses.   No murmur heard. Pulmonary/Chest: Effort normal and breath sounds normal. No respiratory distress. He has no wheezes. He has no rales.  Good air movement. Speaks full sentences. No focal abnormality of crackles, rhonchi or wheezing.  Musculoskeletal: Normal range of motion. He exhibits no edema.  Bilateral Hand/Wrist Inspection: Bulky appearance of bilateral finger MCP and DIP/PIP joints, with swan neck deformity in some locations, also some ulnar deviation of 1st/2nd fingers bilateral, mostly symmetrical no edema or erythema. Palpation: Non tender hand / wrist, carpal bones, including MCP, base of thumb. No distinct anatomical snuff box or scaphoid tenderness.  ROM: Mild to moderate stiffness with finger grip / extension ROM. Strength: 5/5 grip, thumb opposition, wrist flex/ext Neurovascular: distally intact  Lymphadenopathy:    He has no cervical adenopathy.  Neurological: He is alert and oriented to person, place, and time.  Skin: Skin is warm and dry. No rash noted. He is not diaphoretic. No erythema.  Psychiatric: He has a normal mood and affect. His behavior is normal.  Well groomed, good eye contact, normal speech and thoughts  Nursing note and vitals reviewed.    Results for orders placed or performed during the hospital encounter of 06/25/15  Surgical pathology  Result Value Ref Range   SURGICAL PATHOLOGY      Surgical Pathology CASE: [id number] PATIENT: Michelle Piper Surgical Pathology Report     SPECIMEN SUBMITTED: A. Colon polyps x 3, hepatic flexure and transverse, cold snare and cold biopsy B. Colon polyp, rectosigmoid, hot snare C. Rectum polyp x 3, cbx  CLINICAL HISTORY: None provided  PRE-OPERATIVE DIAGNOSIS: Screening  POST-OPERATIVE  DIAGNOSIS: Colon polyps, diverticulosis     DIAGNOSIS: A. COLON POLYPS X 3, HEPATIC FLEXURE AND TRANSVERSE COLON; COLD SNARE AND COLD BIOPSY: - TUBULAR ADENOMAS, 4 FRAGMENTS. - NEGATIVE FOR HIGH-GRADE DYSPLASIA AND MALIGNANCY.  B. COLON POLYP, RECTOSIGMOID; HOT SNARE: - TUBULAR ADENOMA, 0.8 CM. - NEGATIVE FOR HIGH-GRADE DYSPLASIA AND MALIGNANCY.  C. RECTUM POLYP X 3; COLD BIOPSY: - HYPERPLASTIC POLYPS, 3 FRAGMENTS. - NEGATIVE FOR DYSPLASIA AND MALIGNANCY.   GROSS DESCRIPTION: A. Labeled: hepatic flexure polyp and transverse colon polyp cold snare and C BX x 3 Tissue fragment(s): 4 Size: less than 0.1-0.4  cm Description: tan  Entirely submitted in 1 cassette(s).   B. Labeled: rectosigmoid colon polyp hot snare Tissue fragment(s): 1 Size: 0.8 x 0.7 x 0.4 cm Description: tan polypoid fragment, inked blue, bisected  Entirely submitted in one cassette(s).   C. Labeled: rectal polyp C BX x 3 Tissue fragment(s): 3 Size: 0.2-0.3 cm Description: tan  Entirely submitted in 1 cassette(s).  Final Diagnosis performed by Bryan Lemma, MD.  Electronically signed 06/27/2015 9:45:06AM  The electronic signature indicates that the named Attending Pathologist has evaluated the specimen  Technical component performed at [facility], [address] [place], [place] [address] Lab: [phone] Dir: Darrick Penna. Evette Doffing, MD  Professional component performed at [facility], [facility], [place], [place], [place] [address] Lab: [phone] Dir: Dellia Nims. Rubinas, MD        Assessment & Plan:   Problem List Items Addressed This Visit    Screening for prostate cancer    Low risk patient, with prior reported negative screening - Last PSA unknown, prior values normal - Last DRE reported normal years ago - Clinically asymptomatic  Plan: 1. Reviewed prostate cancer screening guidelines and risks including potential prostate biopsy if abnormal PSA, proceed with  yearly PSA for now, and anticipate DRE as needed especially if abnormal PSA or new symptoms      Relevant Orders   PSA, Total with Reflex to PSA, Free   Screening for AAA (abdominal aortic aneurysm)    Due for AAA [place] screening based on risk factors, former smoker Orders placed, to be scheduled      Rheumatoid arthritis involving both hands (HCC)    Stable chronic gradually worsening RA, without recent flare, has finger/hand deformity. Limited therapy, unable to take biologic agents Improved on conservative therapy currently Trial on rx Diclofenac topical for hands, if not approved will try Meloxicam next If worsening or new concerns, consider referral to Rheumatology for another opinion      Relevant Medications   aspirin EC 325 MG tablet   diclofenac sodium (VOLTAREN) 1 % GEL   Other Relevant Orders   COMPLETE METABOLIC PANEL WITH GFR   Dyslipidemia    Controlled cholesterol in past off statin. Was on Atorv due to ASCVD risk Last lipid panel 2016  Plan: 1. Continue current meds - Fish Oil, Vitamins 2. Continue ASA 325mg  daily (advised may reduce to 81mg ) for primary ASCVD risk reduction 3. Encourage improved lifestyle - low carb/cholesterol, reduce portion size, continue improving regular exercise 4. Follow-up 6 months for CPE with fasting labs - re-consider statin in future, would do low dose rosuvastatin intermittent dosing only      Relevant Orders   Lipid panel   RESOLVED: Depression    Resolved. Repeat screening normal. No medications Follow-up if needed      COPD, severe ([place]) - Primary    Stable severe COPD emphysema Followed by [facility] Pulm Dr Raul Del, monitoring PFTs Continues on Incruse Ellipta No recent flare Former smoker Follow-up as planned      Relevant Medications   umeclidinium bromide (INCRUSE ELLIPTA) 62.5 MCG/INH AEPB   Other Relevant Orders   COMPLETE METABOLIC PANEL WITH GFR   Colon cancer screening    Follow-up with Dr Rayann Heman GI to determine  when due next for repeat colonoscopy or what other options are, less likely to proceed with Cologuard given polyps previously       Other Visit Diagnoses    Abnormal blood chemistry       Relevant Orders   CBC with Differential/Platelet   Hemoglobin A1c   Need for hepatitis C screening test       Relevant Orders   Hepatitis C antibody      Meds ordered this encounter  Medications  . umeclidinium bromide (INCRUSE ELLIPTA) 62.5 MCG/INH AEPB    Sig: INHALE 1 INHALATION INTO THE LUNGS ONCE DAILY.  Marland Kitchen DISCONTD: aspirin EC 81 MG tablet    Sig: Take 325 mg by mouth daily.  Marland Kitchen  aspirin EC 325 MG tablet    Sig: Take 1 tablet (325 mg total) by mouth daily.    Dispense:  30 tablet    Refill:  0  . diclofenac sodium (VOLTAREN) 1 % GEL    Sig: Apply 2 g topically 3 (three) times daily as needed. For hand and finger joint arthritis.    Dispense:  100 g    Refill:  2    Follow up plan: Return in about 6 months (around 02/18/2017) for CPE Medicare.  Completed [employer] Screening form to be faxed back.  Nobie Putnam, [facility] 08/20/2016, 12:01 AM

## 2016-08-19 NOTE — Assessment & Plan Note (Addendum)
Stable chronic gradually worsening RA, without recent flare, has finger/hand deformity. Limited therapy, unable to take biologic agents Improved on conservative therapy currently Trial on rx Diclofenac topical for hands, if not approved will try Meloxicam next If worsening or new concerns, consider referral to Rheumatology for another opinion

## 2016-08-19 NOTE — Assessment & Plan Note (Signed)
Resolved. Repeat screening normal. No medications Follow-up if needed

## 2016-08-20 DIAGNOSIS — Z125 Encounter for screening for malignant neoplasm of prostate: Secondary | ICD-10-CM | POA: Insufficient documentation

## 2016-08-20 NOTE — Assessment & Plan Note (Signed)
Follow-up with Dr Rayann Heman GI to determine when due next for repeat colonoscopy or what other options are, less likely to proceed with Cologuard given polyps previously

## 2016-08-20 NOTE — Assessment & Plan Note (Signed)
Low risk patient, with prior reported negative screening - Last PSA unknown, prior values normal - Last DRE reported normal years ago - Clinically asymptomatic  Plan: 1. Reviewed prostate cancer screening guidelines and risks including potential prostate biopsy if abnormal PSA, proceed with yearly PSA for now, and anticipate DRE as needed especially if abnormal PSA or new symptoms

## 2016-08-22 ENCOUNTER — Other Ambulatory Visit: Payer: Self-pay | Admitting: Family Medicine

## 2016-08-22 DIAGNOSIS — Z87891 Personal history of nicotine dependence: Secondary | ICD-10-CM

## 2016-08-22 DIAGNOSIS — Z136 Encounter for screening for cardiovascular disorders: Secondary | ICD-10-CM

## 2016-08-25 ENCOUNTER — Ambulatory Visit
Admission: RE | Admit: 2016-08-25 | Discharge: 2016-08-25 | Disposition: A | Discharge status: Home / Self Care | Payer: Medicare HMO | Source: Ambulatory Visit | Attending: Family Medicine | Admitting: Family Medicine

## 2016-08-25 DIAGNOSIS — Z136 Encounter for screening for cardiovascular disorders: Secondary | ICD-10-CM | POA: Insufficient documentation

## 2016-08-25 DIAGNOSIS — Z87891 Personal history of nicotine dependence: Secondary | ICD-10-CM | POA: Diagnosis not present

## 2016-11-03 ENCOUNTER — Ambulatory Visit (INDEPENDENT_AMBULATORY_CARE_PROVIDER_SITE_OTHER): Payer: Medicare HMO

## 2016-11-03 DIAGNOSIS — Z23 Encounter for immunization: Secondary | ICD-10-CM | POA: Diagnosis not present

## 2016-11-19 DIAGNOSIS — J310 Chronic rhinitis: Secondary | ICD-10-CM | POA: Diagnosis not present

## 2016-11-19 DIAGNOSIS — R0602 Shortness of breath: Secondary | ICD-10-CM | POA: Diagnosis not present

## 2016-11-19 DIAGNOSIS — J449 Chronic obstructive pulmonary disease, unspecified: Secondary | ICD-10-CM | POA: Diagnosis not present

## 2016-11-19 DIAGNOSIS — R05 Cough: Secondary | ICD-10-CM | POA: Diagnosis not present

## 2016-11-24 DIAGNOSIS — J309 Allergic rhinitis, unspecified: Secondary | ICD-10-CM | POA: Diagnosis not present

## 2016-11-24 DIAGNOSIS — M19042 Primary osteoarthritis, left hand: Secondary | ICD-10-CM | POA: Diagnosis not present

## 2016-11-24 DIAGNOSIS — Z6826 Body mass index (BMI) 26.0-26.9, adult: Secondary | ICD-10-CM | POA: Diagnosis not present

## 2016-11-24 DIAGNOSIS — Z7951 Long term (current) use of inhaled steroids: Secondary | ICD-10-CM | POA: Diagnosis not present

## 2016-11-24 DIAGNOSIS — J449 Chronic obstructive pulmonary disease, unspecified: Secondary | ICD-10-CM | POA: Diagnosis not present

## 2016-11-24 DIAGNOSIS — Z87891 Personal history of nicotine dependence: Secondary | ICD-10-CM | POA: Diagnosis not present

## 2016-11-24 DIAGNOSIS — M19041 Primary osteoarthritis, right hand: Secondary | ICD-10-CM | POA: Diagnosis not present

## 2016-11-24 DIAGNOSIS — Z0000 Encounter for general adult medical examination without abnormal findings: Secondary | ICD-10-CM | POA: Diagnosis not present

## 2016-11-24 DIAGNOSIS — J329 Chronic sinusitis, unspecified: Secondary | ICD-10-CM | POA: Diagnosis not present

## 2017-02-18 ENCOUNTER — Other Ambulatory Visit: Payer: Medicare HMO

## 2017-02-18 DIAGNOSIS — E785 Hyperlipidemia, unspecified: Secondary | ICD-10-CM

## 2017-02-18 DIAGNOSIS — J449 Chronic obstructive pulmonary disease, unspecified: Secondary | ICD-10-CM | POA: Diagnosis not present

## 2017-02-18 DIAGNOSIS — R799 Abnormal finding of blood chemistry, unspecified: Secondary | ICD-10-CM | POA: Diagnosis not present

## 2017-02-18 DIAGNOSIS — Z125 Encounter for screening for malignant neoplasm of prostate: Secondary | ICD-10-CM

## 2017-02-18 DIAGNOSIS — Z1159 Encounter for screening for other viral diseases: Secondary | ICD-10-CM | POA: Diagnosis not present

## 2017-02-18 DIAGNOSIS — M069 Rheumatoid arthritis, unspecified: Secondary | ICD-10-CM

## 2017-02-19 LAB — COMPLETE METABOLIC PANEL WITH GFR
AG RATIO: 2.4 (calc) (ref 1.0–2.5)
ALT: 18 U/L (ref 9–46)
AST: 23 U/L (ref 10–35)
Albumin: 4.8 g/dL (ref 3.6–5.1)
Alkaline phosphatase (APISO): 62 U/L (ref 40–115)
BUN: 15 mg/dL (ref 7–25)
CALCIUM: 10.4 mg/dL — AB (ref 8.6–10.3)
CO2: 30 mmol/L (ref 20–32)
CREATININE: 1.15 mg/dL (ref 0.70–1.25)
Chloride: 102 mmol/L (ref 98–110)
GFR, EST AFRICAN AMERICAN: 75 mL/min/{1.73_m2} (ref >=60)
GFR, EST NON AFRICAN AMERICAN: 65 mL/min/{1.73_m2} (ref >=60)
GLUCOSE: 95 mg/dL (ref 65–99)
Globulin: 2 g/dL (calc) (ref 1.9–3.7)
Potassium: 4.7 mmol/L (ref 3.5–5.3)
Sodium: 140 mmol/L (ref 135–146)
TOTAL PROTEIN: 6.8 g/dL (ref 6.1–8.1)
Total Bilirubin: 0.6 mg/dL (ref 0.2–1.2)

## 2017-02-19 LAB — LIPID PANEL
CHOLESTEROL: 241 mg/dL — AB (ref <200)
HDL: 58 mg/dL (ref >40)
LDL Cholesterol (Calc): 158 mg/dL (calc) — ABNORMAL HIGH
Non-HDL Cholesterol (Calc): 183 mg/dL (calc) — ABNORMAL HIGH (ref <130)
TRIGLYCERIDES: 123 mg/dL (ref <150)
Total CHOL/HDL Ratio: 4.2 (calc) (ref <5.0)

## 2017-02-19 LAB — CBC WITH DIFFERENTIAL/PLATELET
Basophils Absolute: 98 cells/uL (ref 0–200)
Basophils Relative: 1.4 %
Eosinophils Absolute: 189 cells/uL (ref 15–500)
Eosinophils Relative: 2.7 %
HEMATOCRIT: 47.1 % (ref 38.5–50.0)
Hemoglobin: 16.1 g/dL (ref 13.2–17.1)
LYMPHS ABS: 1596 {cells}/uL (ref 850–3900)
MCH: 30.1 pg (ref 27.0–33.0)
MCHC: 34.2 g/dL (ref 32.0–36.0)
MCV: 88 fL (ref 80.0–100.0)
MPV: 13.3 fL — ABNORMAL HIGH (ref 7.5–12.5)
Monocytes Relative: 9.8 %
NEUTROS PCT: 63.3 %
Neutro Abs: 4431 cells/uL (ref 1500–7800)
Platelets: 201 10*3/uL (ref 140–400)
RBC: 5.35 10*6/uL (ref 4.20–5.80)
RDW: 12.3 % (ref 11.0–15.0)
Total Lymphocyte: 22.8 %
WBC: 7 10*3/uL (ref 3.8–10.8)
WBCMIX: 686 {cells}/uL (ref 200–950)

## 2017-02-19 LAB — PSA, TOTAL WITH REFLEX TO PSA, FREE: PSA, Total: 3.8 ng/mL (ref <=4.0)

## 2017-02-19 LAB — HEPATITIS C ANTIBODY
Hepatitis C Ab: NONREACTIVE
SIGNAL TO CUT-OFF: 0.27 (ref <1.00)

## 2017-02-19 LAB — HEMOGLOBIN A1C
EAG (MMOL/L): 5.8 (calc)
Hgb A1c MFr Bld: 5.3 % of total Hgb (ref <5.7)
MEAN PLASMA GLUCOSE: 105 (calc)

## 2017-02-20 ENCOUNTER — Ambulatory Visit (INDEPENDENT_AMBULATORY_CARE_PROVIDER_SITE_OTHER): Payer: Medicare HMO | Admitting: Family Medicine

## 2017-02-20 ENCOUNTER — Encounter: Payer: Self-pay | Admitting: Family Medicine

## 2017-02-20 VITALS — BP 114/80 | HR 88 | Resp 16 | Ht 67.0 in | Wt 174.6 lb

## 2017-02-20 DIAGNOSIS — M069 Rheumatoid arthritis, unspecified: Secondary | ICD-10-CM | POA: Diagnosis not present

## 2017-02-20 DIAGNOSIS — Z122 Encounter for screening for malignant neoplasm of respiratory organs: Secondary | ICD-10-CM | POA: Diagnosis not present

## 2017-02-20 DIAGNOSIS — Z0000 Encounter for general adult medical examination without abnormal findings: Secondary | ICD-10-CM | POA: Diagnosis not present

## 2017-02-20 DIAGNOSIS — J449 Chronic obstructive pulmonary disease, unspecified: Secondary | ICD-10-CM

## 2017-02-20 DIAGNOSIS — J302 Other seasonal allergic rhinitis: Secondary | ICD-10-CM

## 2017-02-20 DIAGNOSIS — Z125 Encounter for screening for malignant neoplasm of prostate: Secondary | ICD-10-CM | POA: Diagnosis not present

## 2017-02-20 DIAGNOSIS — E785 Hyperlipidemia, unspecified: Secondary | ICD-10-CM

## 2017-02-20 MED ORDER — PSEUDOEPHEDRINE HCL 30 MG PO TABS: 30.0000 mg | ORAL_TABLET | Freq: Every day | ORAL | 3 refills | Status: DC

## 2017-02-20 MED ORDER — ROSUVASTATIN CALCIUM 10 MG PO TABS: 10.0000 mg | ORAL_TABLET | ORAL | 2 refills | Status: DC

## 2017-02-20 NOTE — Progress Notes (Signed)
Subjective:    Patient ID: Derek Mayo, male    DOB: 12/08/1945, 71 y.o.   MRN: [id number]  Derek Mayo is a 71 y.o. male presenting on 02/20/2017 for Annual Exam (Medicare)   HPI   Here for Annual Physical and Lab Review:  COPD, Severe Stage IV Emphysema // Allergic Rhinitis Followed by [facility] Dr Raul Del (last seen 10/2016), he was stable at that time, continued on current regimen with Incruse, Albuterol, Flovent, and Flonase, Nasal Saline. - Today patient reports he is stable on current regimen, also adds Sudafed generic OTC 30mg  and Azelastine anti histamine nasal spray for controlling his chronic congestion / rhinitis. He tends to use Albuterol daily and continues Flovent - He now has some concerns about may require supplemental oxygen in future, he is asking for new order, he states has had prior PFTs from Dr Raul Del, he does not recall oxygen testing, he states only thinks he would need on PRN basis if exertion outdoors - He will follow-up with Dr Raul Del  Osteoarthritis, multiple joints - Currently doing well, last visit 07/2016 was given rx cream for hand arthritis, but he states did not do too well, not using - He rarely takes OTC Naproxen 220mg  twice weekly PRN - Not taking other meds  PROSTATE SCREENING / Borderline Elevated PSA / Mild BPH - Prior PSA / DRE reported normal. Last PSA 3.8 (01/2017). Currently with mild BPH LUTS, see AUA score. No known family history of prostate CA. He declines DRE screening today. - He admits to some nocturia occasionally only 1x nightly usually after 6 hours, only worse if drink coffee/beer, and daytime frequency if drink either of these, otherwise no problem. - No formal dx of BPH in past - Never on medications  HYPERLIPIDEMIA: - Reports concern with elevated cholesterol now off Statin. Last lipid panel 01/2017, elevated TC and LDL. He was previously on Atorvastatin 10mg  daily but had myalgias had to stop - He would  consider trying other statin at lower intermittent dose  Elevated Calcium Recent labs show Calcium 10.4. Prior result 10/2014 had mild elevated 10.0 No new concerns or symptoms  AUA BPH Symptom Score over past 1 month 1. Sensation of not emptying bladder post void - 2 2. Urinate less than 2 hour after finish last void - 0 3. Start/Stop several times during void - 0 4. Difficult to postpone urination - 0 5. Weak urinary stream - 3 6. Push or strain urination - 0 7. Nocturia - 1 times  Total Score: 6 (Mild BPH symptoms)  Health Maintenance: UTD Flu Vaccine, 11/03/16 UTD Pneumonia vaccines, completed entire series UTD AAA Screening ultrasound, negative 08/2016  Depression screen [facility] 2/9 02/20/2017 08/19/2016 02/11/2016  Decreased Interest 0 0 0  Down, Depressed, Hopeless 0 0 0  PHQ - 2 Score 0 0 0  Altered sleeping - 0 -  Tired, decreased energy - 0 -  Change in appetite - 0 -  Feeling bad or failure about yourself  - 0 -  Trouble concentrating - 0 -  Moving slowly or fidgety/restless - 0 -  Suicidal thoughts - 0 -  PHQ-9 Score - 0 -  Difficult doing work/chores Not difficult at all - -    Past Medical History:  Diagnosis Date  . Allergic rhinitis   . Anemia   . Chicken pox   . COPD (chronic obstructive pulmonary disease) ([place])   . Depression   . Measles   . Mumps   . Premature beats  Past Surgical History:  Procedure Laterality Date  . CHEST SURGERY    . COLONOSCOPY WITH PROPOFOL N/A 06/25/2015   Procedure: COLONOSCOPY WITH PROPOFOL;  Surgeon: Josefine Class, MD;  Location: [facility] ENDOSCOPY;  Service: Endoscopy;  Laterality: N/A;  . KNEE SURGERY Left    removed broken cartilage  . TONSILECTOMY/ADENOIDECTOMY WITH MYRINGOTOMY    . TONSILLECTOMY     Social History   Socioeconomic History  . Marital status: Married    Spouse name: Not on file  . Number of children: Not on file  . Years of education: Not on file  . Highest education level: Not on file  Social  Needs  . Financial resource strain: Not on file  . Food insecurity - worry: Not on file  . Food insecurity - inability: Not on file  . Transportation needs - medical: Not on file  . Transportation needs - non-medical: Not on file  Occupational History  . Not on file  Tobacco Use  . Smoking status: Former Smoker    Packs/day: 1.00    Years: 40.00    Pack years: 40.00    Types: Cigars, Cigarettes    Last attempt to quit: 08/13/2012    Years since quitting: 4.5  . Smokeless tobacco: Never Used  Substance and Sexual Activity  . Alcohol use: Yes    Alcohol/week: 0.6 oz    Types: 1 Cans of beer per week  . Drug use: Yes    Types: Other-see comments    Comment: socially only  . Sexual activity: Not on file  Other Topics Concern  . Not on file  Social History Narrative  . Not on file   Family History  Problem Relation Age of Onset  . Heart disease Father   . AAA (abdominal aortic aneurysm) Brother    Current Outpatient Medications on File Prior to Visit  Medication Sig  . albuterol (PROVENTIL) (2.5 MG/3ML) 0.083% nebulizer solution Inhale 0.083 mLs into the lungs every other day.  . albuterol (VENTOLIN HFA) 108 (90 BASE) MCG/ACT inhaler INHALE 2 PUFFS 4 TIMES A DAY AS NEEDED  . aspirin EC 81 MG tablet Take 81 mg by mouth daily.  . Azelastine HCl 0.15 % SOLN Place 1 spray into both nostrils daily as needed.  . fluticasone (FLONASE) 50 MCG/ACT nasal spray Place 2 sprays into both nostrils daily.  . fluticasone (FLOVENT HFA) 110 MCG/ACT inhaler INHALE 1 PUFF TWICE A DAY  . ipratropium (ATROVENT) 0.03 % nasal spray Place 1 spray into both nostrils daily as needed.  Marland Kitchen PAZEO 0.7 % SOLN Place 1 drop into both eyes daily as needed.  . umeclidinium bromide (INCRUSE ELLIPTA) 62.5 MCG/INH AEPB INHALE 1 INHALATION INTO THE LUNGS ONCE DAILY.  Marland Kitchen diclofenac sodium (VOLTAREN) 1 % GEL Apply 2 g topically 3 (three) times daily as needed. For hand and finger joint arthritis. (Patient not taking:  Reported on 02/20/2017)   No current facility-administered medications on file prior to visit.     Review of Systems  Constitutional: Negative for activity change, appetite change, chills, diaphoresis, fatigue, fever and unexpected weight change.  HENT: Negative for congestion, hearing loss and sinus pressure.   Eyes: Negative for visual disturbance.  Respiratory: Positive for shortness of breath (occasionally related to COPD). Negative for apnea, cough, choking, chest tightness and wheezing.   Cardiovascular: Negative for chest pain, palpitations and leg swelling.  Gastrointestinal: Negative for abdominal distention, abdominal pain, anal bleeding, blood in stool, constipation, diarrhea, nausea and vomiting.  Endocrine:  Negative for cold intolerance and polyuria.  Genitourinary: Negative for decreased urine volume, difficulty urinating, dysuria, flank pain, frequency, hematuria, testicular pain and urgency.       Occasional weak urinary stream  Musculoskeletal: Negative for arthralgias, back pain and neck pain.  Skin: Negative for rash.  Allergic/Immunologic: Negative for environmental allergies.  Neurological: Negative for dizziness, weakness, light-headedness, numbness and headaches.  Hematological: Negative for adenopathy.  Psychiatric/Behavioral: Negative for behavioral problems, dysphoric mood and sleep disturbance. The patient is not nervous/anxious.    Per HPI unless specifically indicated above     Objective:    BP 114/80 (BP Location: Left Arm, Patient Position: Sitting, Cuff Size: Normal)   Pulse 88   Resp 16   Ht 5\' 7"  (1.702 m)   Wt 174 lb 9.6 oz (79.2 kg)   SpO2 97%   BMI 27.35 kg/m   Wt Readings from Last 3 Encounters:  02/20/17 174 lb 9.6 oz (79.2 kg)  08/19/16 172 lb (78 kg)  02/11/16 168 lb (76.2 kg)    Physical Exam  Constitutional: He is oriented to person, place, and time. He appears well-developed and well-nourished. No distress.  Well-appearing,  comfortable, cooperative, smells of 2nd hand smoke  HENT:  Head: Normocephalic and atraumatic.  Mouth/Throat: Oropharynx is clear and moist.  Frontal / maxillary sinuses non-tender. Nares patent without purulence or edema. Bilateral TMs clear without erythema, effusion or bulging. Oropharynx clear without erythema, exudates, edema or asymmetry.  Eyes: Conjunctivae and EOM are normal. Pupils are equal, round, and reactive to light. Right eye exhibits no discharge. Left eye exhibits no discharge.  Neck: Normal range of motion. Neck supple. No thyromegaly present.  No carotid bruits heard  Cardiovascular: Normal rate, regular rhythm, normal heart sounds and intact distal pulses.  No murmur heard. Pulmonary/Chest: Effort normal and breath sounds normal. No respiratory distress. He has no wheezes. He has no rales.  Good air movement. Speaks full sentences. No focal abnormality of crackles, rhonchi or wheezing  Abdominal: Soft. Bowel sounds are normal. He exhibits no distension and no mass. There is no tenderness.  Genitourinary:  Genitourinary Comments: Deferred DRE  Musculoskeletal: Normal range of motion. He exhibits no edema or tenderness.  Upper / Lower Extremities: - Normal muscle tone, strength bilateral upper extremities 5/5, lower extremities 5/5  Lymphadenopathy:    He has no cervical adenopathy.  Neurological: He is alert and oriented to person, place, and time.  Distal sensation intact to light touch all extremities  Skin: Skin is warm and dry. No rash noted. He is not diaphoretic. No erythema.  Psychiatric: He has a normal mood and affect. His behavior is normal.  Well groomed, good eye contact, normal speech and thoughts  Nursing note and vitals reviewed.  Results for orders placed or performed in visit on 02/18/17  Hemoglobin A1c  Result Value Ref Range   Hgb A1c MFr Bld 5.3 <5.7 % of total Hgb   Mean Plasma Glucose 105 (calc)   eAG (mmol/L) 5.8 (calc)  Lipid panel  Result  Value Ref Range   Cholesterol 241 (H) <200 mg/dL   HDL 58 >40 mg/dL   Triglycerides 123 <150 mg/dL   LDL Cholesterol (Calc) 158 (H) mg/dL (calc)   Total CHOL/HDL Ratio 4.2 <5.0 (calc)   Non-HDL Cholesterol (Calc) 183 (H) <130 mg/dL (calc)  CBC with Differential/Platelet  Result Value Ref Range   WBC 7.0 3.8 - 10.8 Thousand/uL   RBC 5.35 4.20 - 5.80 Million/uL   Hemoglobin 16.1 13.2 -  17.1 g/dL   HCT 47.1 38.5 - 50.0 %   MCV 88.0 80.0 - 100.0 fL   MCH 30.1 27.0 - 33.0 pg   MCHC 34.2 32.0 - 36.0 g/dL   RDW 12.3 11.0 - 15.0 %   Platelets 201 140 - 400 Thousand/uL   MPV 13.3 (H) 7.5 - 12.5 fL   Neutro Abs 4,431 1,500 - 7,800 cells/uL   Lymphs Abs 1,596 850 - 3,900 cells/uL   WBC mixed population 686 200 - 950 cells/uL   Eosinophils Absolute 189 15 - 500 cells/uL   Basophils Absolute 98 0 - 200 cells/uL   Neutrophils Relative % 63.3 %   Total Lymphocyte 22.8 %   Monocytes Relative 9.8 %   Eosinophils Relative 2.7 %   Basophils Relative 1.4 %  PSA, Total with Reflex to PSA, Free  Result Value Ref Range   PSA, Total 3.8 < OR = 4.0 ng/mL  Hepatitis C antibody  Result Value Ref Range   Hepatitis C Ab NON-REACTIVE NON-REACTI   SIGNAL TO CUT-OFF 0.27 <1.00  COMPLETE METABOLIC PANEL WITH GFR  Result Value Ref Range   Glucose, Bld 95 65 - 99 mg/dL   BUN 15 7 - 25 mg/dL   Creat 1.15 0.70 - 1.25 mg/dL   GFR, Est Non African American 65 > OR = 60 mL/min/1.18m2   GFR, Est African American 75 > OR = 60 mL/min/1.8m2   BUN/Creatinine Ratio NOT APPLICABLE 6 - 22 (calc)   Sodium 140 135 - 146 mmol/L   Potassium 4.7 3.5 - 5.3 mmol/L   Chloride 102 98 - 110 mmol/L   CO2 30 20 - 32 mmol/L   Calcium 10.4 (H) 8.6 - 10.3 mg/dL   Total Protein 6.8 6.1 - 8.1 g/dL   Albumin 4.8 3.6 - 5.1 g/dL   Globulin 2.0 1.9 - 3.7 g/dL (calc)   AG Ratio 2.4 1.0 - 2.5 (calc)   Total Bilirubin 0.6 0.2 - 1.2 mg/dL   Alkaline phosphatase (APISO) 62 40 - 115 U/L   AST 23 10 - 35 U/L   ALT 18 9 - 46 U/L        Assessment & Plan:   Problem List Items Addressed This Visit    Allergic rhinitis, seasonal    Stable Continue current regimen New order rx Sudafed 30mg  daily sent 90 day supply, per pharmacy request since he was taking OTC regularly Continue Azelastine, Nasal saline      Relevant Medications   pseudoephedrine (SUDAFED) 30 MG tablet   COPD, severe (HCC)    Severe COPD emphysema, mostly stable but has some concern about exertional dyspnea and asking about supplemental O2 Today at rest POx 97% Followed by [facility] Dr Raul Del, monitoring PFTs Former smoker, now second hand smoke daily with wife  Plan: 1. Recommend that patient contact his Pulmonology Dr Raul Del as soon as he can to discuss supplemental Oxygen requirements, testing and rx O2 orders depending on what equipment he needs. If he needs then we could consider 6 min walk test here, and referral to other company for overnight pulse oximetry as well. - Continues on Incruse Ellipta, Flovent, Albuterol - Continue sinus treatments 2. Referral for Low Dose Lung CA CT Screening as initial screen - prior CXR negative, sent message to Burgess Estelle at [facility] for arranging - one concern was with mild elevated Calcium 10.4, potential for related to underlying malignancy although suspect less likely at this result      Relevant Medications  pseudoephedrine (SUDAFED) 30 MG tablet   Dyslipidemia    Uncontrolled cholesterol now off statin, improving lifestyle, previously better controlled on Atorv but had myalgias, failed 10mg  Last lipid panel 01/2017 Calculated ASCVD 10 yr risk score 15-19%, former smoker  Plan: 1. RESTART Statin therapy after counseling on ASCVD risk reduction - Start Rosuvastatin 10mg  at bedtime - WEEKLY then slow titrate up to 3 times weekly, may consider inc dose in future if tolerated 2. Continue ASA 81mg  for primary ASCVD risk reduction 3. Encourage improved lifestyle - low carb/cholesterol, reduce portion size,  continue improving regular exercise 4. Follow-up q 1 yr fasting lipids - will not re-check in 6 months      Relevant Medications   rosuvastatin (CRESTOR) 10 MG tablet   Rheumatoid arthritis involving both hands (HCC)    Stable Limited improvement on topical diclofenac Continue infrequent NSAID for now Monitor Cr      Relevant Medications   aspirin EC 81 MG tablet   Screening for prostate cancer    Avg / Low risk patient, with prior reported negative screening - Last PSA 3.8 (01/2017), prior values normal as reported not available - Last DRE years ago reported normal - Clinically mild BPH LUTS, AUA score 6  Plan: 1.Reassurance for now based on PSA - will track yearly, sooner if worsening symptoms - Reviewed prostate cancer screening guidelines and risks including potential prostate biopsy if abnormal PSA, proceed with yearly PSA for now, and anticipate DRE as needed especially if abnormal PSA or new symptoms       Other Visit Diagnoses    Annual physical exam    -  Primary   Encounter for screening for lung cancer       Message in Epic sent to Burgess Estelle for proceeding with scheduling [facility] smoking class / approval for Low Dose CT Lung CA Screen, they will contact patient   Serum calcium elevated       Presumed benign, however 10.4, will proceed with checking low dose CT chest for lung CA screening given his history and routine recommendation, re-check 1 yr      Meds ordered this encounter  Medications  . pseudoephedrine (SUDAFED) 30 MG tablet    Sig: Take 1 tablet (30 mg total) by mouth daily.    Dispense:  90 tablet    Refill:  3  . rosuvastatin (CRESTOR) 10 MG tablet    Sig: Take 1 tablet (10 mg total) by mouth 3 (three) times a week.    Dispense:  15 tablet    Refill:  2   Follow up plan: Return in about 6 months (around 08/21/2017) for HLD statin discuss, COPD oxygen.  Nobie Putnam, [facility] 02/20/2017, 12:59 PM

## 2017-02-20 NOTE — Assessment & Plan Note (Signed)
Avg / Low risk patient, with prior reported negative screening - Last PSA 3.8 (01/2017), prior values normal as reported not available - Last DRE years ago reported normal - Clinically mild BPH LUTS, AUA score 6  Plan: 1.Reassurance for now based on PSA - will track yearly, sooner if worsening symptoms - Reviewed prostate cancer screening guidelines and risks including potential prostate biopsy if abnormal PSA, proceed with yearly PSA for now, and anticipate DRE as needed especially if abnormal PSA or new symptoms

## 2017-02-20 NOTE — Assessment & Plan Note (Signed)
Severe COPD emphysema, mostly stable but has some concern about exertional dyspnea and asking about supplemental O2 Today at rest POx 97% Followed by [facility] Dr Raul Del, monitoring PFTs Former smoker, now second hand smoke daily with wife  Plan: 1. Recommend that patient contact his Pulmonology Dr Raul Del as soon as he can to discuss supplemental Oxygen requirements, testing and rx O2 orders depending on what equipment he needs. If he needs then we could consider 6 min walk test here, and referral to other company for overnight pulse oximetry as well. - Continues on Incruse Ellipta, Flovent, Albuterol - Continue sinus treatments 2. Referral for Low Dose Lung CA CT Screening as initial screen - prior CXR negative, sent message to Burgess Estelle at [facility] for arranging - one concern was with mild elevated Calcium 10.4, potential for related to underlying malignancy although suspect less likely at this result

## 2017-02-20 NOTE — Assessment & Plan Note (Signed)
Stable Limited improvement on topical diclofenac Continue infrequent NSAID for now Monitor Cr

## 2017-02-20 NOTE — Patient Instructions (Addendum)
Thank you for coming to the office today.  1. Please contact Dr Gust Brooms Pulmonology office regarding your request for supplemental Oxygen - they can better help you with testing and documentation and ordering the correct supplies, as our office does not do this routinely  2. For elevated cholesterol, start with Rosuvastatin 10mg  at bedtime once weekly, for approximately 3-4 weeks, then may increase to TWICE weekly, 3-4 weeks then may increase again to THREE times weekly, if get muscle aches can reduce dose or possibly even cut in half, let me know if need to change  3.Sent a message to [place], they will contact you with how to proceed  Low Dose Chest CT Lung CA Screening - Age 52-74 - Smoking history >30 pack years - Annual screening for 15 years after quit date  [place] Smoking Cessation Class Ph: [phone]  Please schedule a Follow-up Appointment to: Return in about 6 months (around 08/21/2017) for HLD statin discuss, COPD oxygen.  If you have any other questions or concerns, please feel free to call the office or send a message through [place]. You may also schedule an earlier appointment if necessary.  Additionally, you may be receiving a survey about your experience at our office within a few days to 1 week by e-mail or mail. We value your feedback.  Nobie Putnam, DO [place]

## 2017-02-20 NOTE — Assessment & Plan Note (Signed)
Stable °Continue current regimen °New order rx Sudafed 30mg daily sent 90 day supply, per pharmacy request since he was taking OTC regularly °Continue Azelastine, Nasal saline °

## 2017-02-20 NOTE — Assessment & Plan Note (Signed)
Uncontrolled cholesterol now off statin, improving lifestyle, previously better controlled on Atorv but had myalgias, failed 10mg  Last lipid panel 01/2017 Calculated ASCVD 10 yr risk score 15-19%, former smoker  Plan: 1. RESTART Statin therapy after counseling on ASCVD risk reduction - Start Rosuvastatin 10mg  at bedtime - WEEKLY then slow titrate up to 3 times weekly, may consider inc dose in future if tolerated 2. Continue ASA 81mg  for primary ASCVD risk reduction 3. Encourage improved lifestyle - low carb/cholesterol, reduce portion size, continue improving regular exercise 4. Follow-up q 1 yr fasting lipids - will not re-check in 6 months

## 2017-02-25 ENCOUNTER — Telehealth: Payer: Self-pay | Admitting: *Deleted

## 2017-02-25 DIAGNOSIS — Z87891 Personal history of nicotine dependence: Secondary | ICD-10-CM

## 2017-02-25 DIAGNOSIS — Z122 Encounter for screening for malignant neoplasm of respiratory organs: Secondary | ICD-10-CM

## 2017-02-25 NOTE — Telephone Encounter (Signed)
Received referral for initial lung cancer screening scan. Contacted patient and obtained smoking history,(former, quit 08/13/12, 40 pack year) as well as answering questions related to screening process. Patient denies signs of lung cancer such as weight loss or hemoptysis. Patient denies comorbidity that would prevent curative treatment if lung cancer were found. Patient is scheduled for shared decision making visit and CT scan on 03/11/17.

## 2017-03-11 ENCOUNTER — Ambulatory Visit
Admission: RE | Admit: 2017-03-11 | Discharge: 2017-03-11 | Disposition: A | Discharge status: Home / Self Care | Payer: Medicare HMO | Source: Ambulatory Visit | Attending: Nurse Practitioner | Admitting: Nurse Practitioner

## 2017-03-11 ENCOUNTER — Inpatient Hospital Stay: Payer: Medicare HMO | Attending: Nurse Practitioner | Admitting: Oncology

## 2017-03-11 DIAGNOSIS — Z122 Encounter for screening for malignant neoplasm of respiratory organs: Secondary | ICD-10-CM

## 2017-03-11 DIAGNOSIS — Z87891 Personal history of nicotine dependence: Secondary | ICD-10-CM | POA: Diagnosis not present

## 2017-03-11 DIAGNOSIS — J439 Emphysema, unspecified: Secondary | ICD-10-CM | POA: Diagnosis not present

## 2017-03-11 DIAGNOSIS — I700 Atherosclerosis of aorta: Secondary | ICD-10-CM | POA: Diagnosis not present

## 2017-03-13 ENCOUNTER — Encounter: Payer: Self-pay | Admitting: *Deleted

## 2017-03-13 NOTE — Progress Notes (Signed)
In accordance with CMS guidelines, patient has met eligibility criteria including age, absence of signs or symptoms of lung cancer.  Social History   Tobacco Use  . Smoking status: Former Smoker    Packs/day: 1.00    Years: 40.00    Pack years: 40.00    Types: Cigars, Cigarettes    Last attempt to quit: 08/13/2012    Years since quitting: 4.5  . Smokeless tobacco: Never Used  Substance Use Topics  . Alcohol use: Yes    Alcohol/week: 0.6 oz    Types: 1 Cans of beer per week  . Drug use: Yes    Types: Other-see comments    Comment: socially only     A shared decision-making session was conducted prior to the performance of CT scan. This includes one or more decision aids, includes benefits and harms of screening, follow-up diagnostic testing, over-diagnosis, false positive rate, and total radiation exposure.  Counseling on the importance of adherence to annual lung cancer LDCT screening, impact of co-morbidities, and ability or willingness to undergo diagnosis and treatment is imperative for compliance of the program.  Counseling on the importance of continued smoking cessation for former smokers; the importance of smoking cessation for current smokers, and information about tobacco cessation interventions have been given to patient including [place] and 1800 quit [place] programs.  Written order for lung cancer screening with LDCT has been given to the patient and any and all questions have been answered to the best of my abilities.   Yearly follow up will be coordinated by Burgess Estelle, Thoracic Navigator.  Faythe Casa, NP 03/13/2017 8:55 AM

## 2017-03-17 ENCOUNTER — Other Ambulatory Visit: Payer: Self-pay | Admitting: Family Medicine

## 2017-03-17 DIAGNOSIS — E785 Hyperlipidemia, unspecified: Secondary | ICD-10-CM

## 2017-03-17 MED ORDER — ROSUVASTATIN CALCIUM 10 MG PO TABS: 10.0000 mg | ORAL_TABLET | ORAL | 1 refills | Status: DC

## 2017-05-19 DIAGNOSIS — R0609 Other forms of dyspnea: Secondary | ICD-10-CM | POA: Diagnosis not present

## 2017-05-19 DIAGNOSIS — J449 Chronic obstructive pulmonary disease, unspecified: Secondary | ICD-10-CM | POA: Diagnosis not present

## 2017-07-04 ENCOUNTER — Other Ambulatory Visit: Payer: Self-pay | Admitting: Family Medicine

## 2017-08-21 ENCOUNTER — Ambulatory Visit (INDEPENDENT_AMBULATORY_CARE_PROVIDER_SITE_OTHER): Payer: Medicare HMO | Admitting: Family Medicine

## 2017-08-21 ENCOUNTER — Encounter: Payer: Self-pay | Admitting: Family Medicine

## 2017-08-21 ENCOUNTER — Other Ambulatory Visit: Payer: Self-pay | Admitting: Family Medicine

## 2017-08-21 VITALS — BP 137/77 | HR 78 | Temp 97.7°F | Resp 16 | Ht 67.0 in | Wt 166.8 lb

## 2017-08-21 DIAGNOSIS — M069 Rheumatoid arthritis, unspecified: Secondary | ICD-10-CM | POA: Diagnosis not present

## 2017-08-21 DIAGNOSIS — J449 Chronic obstructive pulmonary disease, unspecified: Secondary | ICD-10-CM | POA: Diagnosis not present

## 2017-08-21 DIAGNOSIS — Z0000 Encounter for general adult medical examination without abnormal findings: Secondary | ICD-10-CM

## 2017-08-21 DIAGNOSIS — E785 Hyperlipidemia, unspecified: Secondary | ICD-10-CM

## 2017-08-21 DIAGNOSIS — Z87891 Personal history of nicotine dependence: Secondary | ICD-10-CM | POA: Insufficient documentation

## 2017-08-21 DIAGNOSIS — J302 Other seasonal allergic rhinitis: Secondary | ICD-10-CM | POA: Diagnosis not present

## 2017-08-21 DIAGNOSIS — R7309 Other abnormal glucose: Secondary | ICD-10-CM

## 2017-08-21 DIAGNOSIS — R972 Elevated prostate specific antigen [PSA]: Secondary | ICD-10-CM

## 2017-08-21 MED ORDER — IPRATROPIUM BROMIDE 0.03 % NA SOLN: 1.0000 | Freq: Every day | NASAL | 3 refills | Status: DC | PRN

## 2017-08-21 MED ORDER — AZELASTINE HCL 0.15 % NA SOLN: 1.0000 | Freq: Every day | NASAL | 3 refills | Status: DC | PRN

## 2017-08-21 MED ORDER — FLUTICASONE PROPIONATE 50 MCG/ACT NA SUSP
2.0000 | Freq: Every day | NASAL | 3 refills | Status: DC
Start: 2017-08-21 — End: 2017-12-31

## 2017-08-21 MED ORDER — ROSUVASTATIN CALCIUM 10 MG PO TABS
10.0000 mg | ORAL_TABLET | ORAL | 3 refills | Status: DC
Start: 2017-08-24 — End: 2017-08-28

## 2017-08-21 NOTE — Assessment & Plan Note (Signed)
Stable without exacerbation Severe COPD emphysema Today at rest POx 99%. Not on supplemental O2 regularly. He has self purchased portable O2 PRN only. Followed by [facility] Pulm Dr Donell Beers on [place] yearly Former smoker, now second hand smoke  Plan: 1. Follow-up with Dr Raul Del as planned - Continues on Incruse Ellipta, Flovent, Albuterol - Continue sinus treatments - refilled Flonase, Atrovent, Azelastine sprays

## 2017-08-21 NOTE — Assessment & Plan Note (Addendum)
Last lab uncontrolled cholesterol off statin, improving lifestyle still Last lipid panel 01/2017 Failed Atorvastatin 10mg  myalgias Calculated ASCVD 10 yr risk score 15-19%, former smoker  Plan: 1. Continue Rosuvastatin 10mg  TWICE weekly - highest tolerated dose, failed 3 times weekly 2. Continue ASA 81mg  for primary ASCVD risk reduction 3. Encourage improved lifestyle - low carb/cholesterol, reduce portion size, continue improving regular exercise 4. Follow-up in 6 months for annual and lipids  Ordered ABI testing for lower extremity as patient is former smoker and has hyperlipidemia. He has fam history of vascular disease. [facility] and they confirm this test is covered, he may have $40 copay or deductible. Ordered to be done at [place]

## 2017-08-21 NOTE — Progress Notes (Signed)
Subjective:    Patient ID: Derek Mayo, male    DOB: 1945-08-27, 72 y.o.   MRN: [id number]  Derek Mayo is a 72 y.o. male presenting on 08/21/2017 for COPD and Hyperlipidemia   HPI   Hyperlipidemia Last visit 01/2017, he was started on Rosuvastatin and discontinued from previous med Atorvastatin 10mg , gradually increased up to Rosuvastatin 10mg  weekly then up to twice week and then three times weekly, after few weeks he had intolerance and side effects muscle pains, he had a fall and injured right leg, it recovered and he tried again restarted Rosuvastatin now settled on 10mg  twice weekly only, doing well for several months now - Has some Right lower leg numbness after problem but it is improved - he is a former smoker. 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 He does not qualify for supplemental O2 on last testing. But patient purchases his own back up portable O2 as PRN only if needed. He rarely uses. - Today requesting refill on his allergy and nasal spray regimen, uses Flonase regularly and PRN Atrovent for congestion and Azelastine PRN for allergies - History of deviated nasal septum Denies dyspnea, cough, chest pain, sinus pain or pressure, congestion currently  Depression screen [facility] 2/9 08/21/2017 08/21/2017 02/20/2017  Decreased Interest 0 0 0  Down, Depressed, Hopeless 0 0 0    PHQ - 2 Score 0 0 0  Altered sleeping 0 - -  Tired, decreased energy 0 - -  Change in appetite 0 - -  Feeling bad or failure about yourself  0 - -  Trouble concentrating 0 - -  Moving slowly or fidgety/restless 0 - -  Suicidal thoughts 0 - -  PHQ-9 Score 0 - -  Difficult doing work/chores Not difficult at all - Not difficult at all    Social History   Tobacco Use  . Smoking status: Former Smoker    Packs/day: 1.00    Years: 40.00    Pack years: 40.00    Types: Cigars, Cigarettes    Last attempt to quit: 08/13/2012    Years since quitting: 5.0  . Smokeless tobacco: Never Used  Substance Use Topics  . Alcohol use: Yes    Alcohol/week: 0.6 oz    Types: 1 Cans of beer per week  . Drug use: Yes    Types: Other-see comments    Comment: socially only    Review of Systems Per HPI unless specifically indicated above     Objective:    BP 137/77   Pulse 78   Temp 97.7 F (36.5 C) (Oral)   Resp 16   Ht 5\' 7"  (1.702 m)   Wt 166 lb 12.8 oz (75.7 kg)   SpO2 99%   BMI 26.12 kg/m   Wt Readings from Last 3 Encounters:  08/21/17 166 lb 12.8 oz (75.7 kg)  03/11/17 160 lb (72.6 kg)  02/20/17 174 lb 9.6 oz (  79.2 kg)    Physical Exam  Constitutional: He is oriented to person, place, and time. He appears well-developed and well-nourished. No distress.  Well-appearing, comfortable, cooperative  HENT:  Head: Normocephalic and atraumatic.  Mouth/Throat: Oropharynx is clear and moist.  Frontal / maxillary sinuses non-tender. Nares patent with mild deeper turbinate edema without congestion, some deviation of septum. Bilateral TMs clear without erythema, effusion or bulging. Oropharynx clear without erythema, exudates, edema or asymmetry.  Neck:  No carotid bruits heard  Cardiovascular: Normal rate, regular rhythm, normal heart sounds and intact distal pulses.  No murmur heard. Pulmonary/Chest: Effort normal and breath sounds normal. No respiratory distress. He has no wheezes. He  has no rales.  Good air movement. Speaks full sentences. No focal abnormality of crackles, rhonchi or wheezing. Not on O2  Genitourinary:  Genitourinary Comments: Deferred DRE  Musculoskeletal: He exhibits edema (Some mild edema of hand L > R MCP joints). He exhibits no tenderness.  Upper / Lower Extremities: - Normal muscle tone, strength bilateral upper extremities 5/5, lower extremities 5/5  Neurological: He is alert and oriented to person, place, and time.  Skin: Skin is warm and dry. No rash noted. He is not diaphoretic. No erythema.  Psychiatric: He has a normal mood and affect. His behavior is normal.  Well groomed, good eye contact, normal speech and thoughts  Nursing note and vitals reviewed.  Results for orders placed or performed in visit on 02/18/17  Hemoglobin A1c  Result Value Ref Range   Hgb A1c MFr Bld 5.3 <5.7 % of total Hgb   Mean Plasma Glucose 105 (calc)   eAG (mmol/L) 5.8 (calc)  Lipid panel  Result Value Ref Range   Cholesterol 241 (H) <200 mg/dL   HDL 58 >40 mg/dL   Triglycerides 123 <150 mg/dL   LDL Cholesterol (Calc) 158 (H) mg/dL (calc)   Total CHOL/HDL Ratio 4.2 <5.0 (calc)   Non-HDL Cholesterol (Calc) 183 (H) <130 mg/dL (calc)  CBC with Differential/Platelet  Result Value Ref Range   WBC 7.0 3.8 - 10.8 Thousand/uL   RBC 5.35 4.20 - 5.80 Million/uL   Hemoglobin 16.1 13.2 - 17.1 g/dL   HCT 47.1 38.5 - 50.0 %   MCV 88.0 80.0 - 100.0 fL   MCH 30.1 27.0 - 33.0 pg   MCHC 34.2 32.0 - 36.0 g/dL   RDW 12.3 11.0 - 15.0 %   Platelets 201 140 - 400 Thousand/uL   MPV 13.3 (H) 7.5 - 12.5 fL   Neutro Abs 4,431 1,500 - 7,800 cells/uL   Lymphs Abs 1,596 850 - 3,900 cells/uL   WBC mixed population 686 200 - 950 cells/uL   Eosinophils Absolute 189 15 - 500 cells/uL   Basophils Absolute 98 0 - 200 cells/uL   Neutrophils Relative % 63.3 %   Total Lymphocyte 22.8 %   Monocytes Relative 9.8 %   Eosinophils Relative 2.7 %   Basophils Relative 1.4 %  PSA, Total  with Reflex to PSA, Free  Result Value Ref Range   PSA, Total 3.8 < OR = 4.0 ng/mL  Hepatitis C antibody  Result Value Ref Range   Hepatitis C Ab NON-REACTIVE NON-REACTI   SIGNAL TO CUT-OFF 0.27 <1.00  COMPLETE METABOLIC PANEL WITH GFR  Result Value Ref Range   Glucose, Bld 95 65 - 99 mg/dL   BUN 15 7 - 25 mg/dL   Creat 1.15 0.70 - 1.25 mg/dL   GFR, Est Non African American 65 > OR = 60 mL/min/1.48m2  GFR, Est African American 75 > OR = 60 mL/min/1.6m2   BUN/Creatinine Ratio NOT APPLICABLE 6 - 22 (calc)   Sodium 140 135 - 146 mmol/L   Potassium 4.7 3.5 - 5.3 mmol/L   Chloride 102 98 - 110 mmol/L   CO2 30 20 - 32 mmol/L   Calcium 10.4 (H) 8.6 - 10.3 mg/dL   Total Protein 6.8 6.1 - 8.1 g/dL   Albumin 4.8 3.6 - 5.1 g/dL   Globulin 2.0 1.9 - 3.7 g/dL (calc)   AG Ratio 2.4 1.0 - 2.5 (calc)   Total Bilirubin 0.6 0.2 - 1.2 mg/dL   Alkaline phosphatase (APISO) 62 40 - 115 U/L   AST 23 10 - 35 U/L   ALT 18 9 - 46 U/L      Assessment & Plan:   Problem List Items Addressed This Visit    COPD, severe ([place])    Stable without exacerbation Severe COPD emphysema Today at rest POx 99%. Not on supplemental O2 regularly. He has self purchased portable O2 PRN only. Followed by [facility] Dr Donell Beers on [place] yearly Former smoker, now second hand smoke  Plan: 1. Follow-up with Dr Raul Del as planned - Continues on Incruse Ellipta, Flovent, Albuterol - Continue sinus treatments - refilled Flonase, Atrovent, Azelastine sprays      Relevant Medications   Azelastine HCl 0.15 % SOLN   ipratropium (ATROVENT) 0.03 % nasal spray   fluticasone (FLONASE) 50 MCG/ACT nasal spray   Dyslipidemia - Primary    Last lab uncontrolled cholesterol off statin, improving lifestyle still Last lipid panel 01/2017 Failed Atorvastatin 10mg  myalgias Calculated ASCVD 10 yr risk score 15-19%, former smoker  Plan: 1. Continue Rosuvastatin 10mg  TWICE weekly - highest tolerated dose,  failed 3 times weekly 2. Continue ASA 81mg  for primary ASCVD risk reduction 3. Encourage improved lifestyle - low carb/cholesterol, reduce portion size, continue improving regular exercise 4. Follow-up in 6 months for annual and lipids  Ordered ABI testing for lower extremity as patient is former smoker and has hyperlipidemia. He has fam history of vascular disease. [facility] and they confirm this test is covered, he may have $40 copay or deductible. Ordered to be done at [facility] [place]      Relevant Medications   rosuvastatin (CRESTOR) 10 MG tablet (Start on 08/24/2017)   Other Relevant Orders   VAS [place] ABI WITH/WO TBI   Rheumatoid arthritis involving both hands ([place])    Stable with occasional episodic flares w/ inc repetitive activity Limited improvement on topical diclofenac - DISCONTINUE Continue infrequent NSAID for now - Aleve OTC intermittent only, he is aware of risks on these meds      Relevant Medications   naproxen (NAPROSYN) 250 MG tablet    Other Visit Diagnoses    Chronic seasonal allergic rhinitis     Stable, controlled Refilled sprays    Relevant Medications   Azelastine HCl 0.15 % SOLN   ipratropium (ATROVENT) 0.03 % nasal spray   fluticasone (FLONASE) 50 MCG/ACT nasal spray      Meds ordered this encounter  Medications  . rosuvastatin (CRESTOR) 10 MG tablet    Sig: Take 1 tablet (10 mg total) by mouth 2 (two) times a week.    Dispense:  24 tablet    Refill:  3    Dose changed from 3 times week down to 2 times week. Do not fill rx until patient ready  . Azelastine HCl 0.15 % SOLN  Sig: Place 1 spray into both nostrils daily as needed.    Dispense:  30 mL    Refill:  3    Keep refills on file  . ipratropium (ATROVENT) 0.03 % nasal spray    Sig: Place 1 spray into both nostrils daily as needed.    Dispense:  30 mL    Refill:  3    Keep refills on file if patient not ready  . fluticasone (FLONASE) 50 MCG/ACT nasal spray    Sig: Place 2  sprays into both nostrils daily.    Dispense:  16 g    Refill:  3    Keep refills on file    Follow up plan: Return in about 6 months (around 02/20/2018) for Annual Physical.  Future labs ordered for 02/15/18  Completed [facility] [place] 08/21/2017, 12:47 PM

## 2017-08-21 NOTE — Assessment & Plan Note (Signed)
Stable with occasional episodic flares w/ inc repetitive activity Limited improvement on topical diclofenac - DISCONTINUE Continue infrequent NSAID for now - Aleve OTC intermittent only, he is aware of risks on these meds

## 2017-08-21 NOTE — Patient Instructions (Addendum)
Thank you for coming to the office today.  Continue on Rosuvastatin twice a week as you are - I am happy with this result and think that it will help continue to prevent problems and reduce risk of vascular issues.  Refilled all nasal sprays  We will check with the Cardiology Team to see if they can run a screening test for your lower extremities to determine if you have any blockage in arteries - we can try to check coverage on this and they will call you to arrange if interested.  DUE for FASTING BLOOD WORK (no food or drink after midnight before the lab appointment, only water or coffee without cream/sugar on the morning of)  SCHEDULE "Lab Only" visit in the morning at the clinic for lab draw in 6 MONTHS   - Make sure Lab Only appointment is at about 1 week before your next appointment, so that results will be available  For Lab Results, once available within 2-3 days of blood draw, you can can log in to MyChart online to view your results and a brief explanation. Also, we can discuss results at next follow-up visit.   Please schedule a Follow-up Appointment to: Return in about 6 months (around 02/20/2018) for Annual Physical.  If you have any other questions or concerns, please feel free to call the office or send a message through [place]. You may also schedule an earlier appointment if necessary.  Additionally, you may be receiving a survey about your experience at our office within a few days to 1 week by e-mail or mail. We value your feedback.  Nobie Putnam, DO [place]

## 2017-08-28 ENCOUNTER — Other Ambulatory Visit: Payer: Self-pay | Admitting: Family Medicine

## 2017-08-28 DIAGNOSIS — E785 Hyperlipidemia, unspecified: Secondary | ICD-10-CM

## 2017-10-02 ENCOUNTER — Other Ambulatory Visit: Payer: Self-pay | Admitting: Family Medicine

## 2017-10-02 DIAGNOSIS — I739 Peripheral vascular disease, unspecified: Secondary | ICD-10-CM

## 2017-10-05 ENCOUNTER — Ambulatory Visit (INDEPENDENT_AMBULATORY_CARE_PROVIDER_SITE_OTHER): Payer: Medicare HMO

## 2017-10-05 DIAGNOSIS — I739 Peripheral vascular disease, unspecified: Secondary | ICD-10-CM | POA: Diagnosis not present

## 2017-10-20 DIAGNOSIS — R0609 Other forms of dyspnea: Secondary | ICD-10-CM | POA: Diagnosis not present

## 2017-10-20 DIAGNOSIS — J449 Chronic obstructive pulmonary disease, unspecified: Secondary | ICD-10-CM | POA: Diagnosis not present

## 2017-11-27 ENCOUNTER — Ambulatory Visit (INDEPENDENT_AMBULATORY_CARE_PROVIDER_SITE_OTHER): Payer: Medicare HMO

## 2017-11-27 DIAGNOSIS — Z23 Encounter for immunization: Secondary | ICD-10-CM

## 2017-12-31 ENCOUNTER — Other Ambulatory Visit: Payer: Self-pay | Admitting: Family Medicine

## 2017-12-31 DIAGNOSIS — J302 Other seasonal allergic rhinitis: Secondary | ICD-10-CM

## 2018-02-09 DIAGNOSIS — M1990 Unspecified osteoarthritis, unspecified site: Secondary | ICD-10-CM | POA: Diagnosis not present

## 2018-02-09 DIAGNOSIS — J309 Allergic rhinitis, unspecified: Secondary | ICD-10-CM | POA: Diagnosis not present

## 2018-02-09 DIAGNOSIS — M069 Rheumatoid arthritis, unspecified: Secondary | ICD-10-CM | POA: Diagnosis not present

## 2018-02-09 DIAGNOSIS — N529 Male erectile dysfunction, unspecified: Secondary | ICD-10-CM | POA: Diagnosis not present

## 2018-02-09 DIAGNOSIS — R69 Illness, unspecified: Secondary | ICD-10-CM | POA: Diagnosis not present

## 2018-02-09 DIAGNOSIS — I739 Peripheral vascular disease, unspecified: Secondary | ICD-10-CM | POA: Diagnosis not present

## 2018-02-09 DIAGNOSIS — G8929 Other chronic pain: Secondary | ICD-10-CM | POA: Diagnosis not present

## 2018-02-09 DIAGNOSIS — E785 Hyperlipidemia, unspecified: Secondary | ICD-10-CM | POA: Diagnosis not present

## 2018-02-09 DIAGNOSIS — M810 Age-related osteoporosis without current pathological fracture: Secondary | ICD-10-CM | POA: Diagnosis not present

## 2018-02-09 DIAGNOSIS — J439 Emphysema, unspecified: Secondary | ICD-10-CM | POA: Diagnosis not present

## 2018-02-15 ENCOUNTER — Other Ambulatory Visit: Payer: Medicare HMO

## 2018-02-15 DIAGNOSIS — R972 Elevated prostate specific antigen [PSA]: Secondary | ICD-10-CM | POA: Diagnosis not present

## 2018-02-15 DIAGNOSIS — M069 Rheumatoid arthritis, unspecified: Secondary | ICD-10-CM

## 2018-02-15 DIAGNOSIS — J449 Chronic obstructive pulmonary disease, unspecified: Secondary | ICD-10-CM

## 2018-02-15 DIAGNOSIS — Z0000 Encounter for general adult medical examination without abnormal findings: Secondary | ICD-10-CM

## 2018-02-15 DIAGNOSIS — R7309 Other abnormal glucose: Secondary | ICD-10-CM

## 2018-02-15 DIAGNOSIS — E785 Hyperlipidemia, unspecified: Secondary | ICD-10-CM | POA: Diagnosis not present

## 2018-02-16 LAB — COMPLETE METABOLIC PANEL WITH GFR
AG Ratio: 2.3 (calc) (ref 1.0–2.5)
ALBUMIN MSPROF: 4.8 g/dL (ref 3.6–5.1)
ALT: 15 U/L (ref 9–46)
AST: 21 U/L (ref 10–35)
Alkaline phosphatase (APISO): 56 U/L (ref 40–115)
BUN: 16 mg/dL (ref 7–25)
CALCIUM: 10.4 mg/dL — AB (ref 8.6–10.3)
CO2: 29 mmol/L (ref 20–32)
CREATININE: 1.15 mg/dL (ref 0.70–1.18)
Chloride: 102 mmol/L (ref 98–110)
GFR, EST AFRICAN AMERICAN: 74 mL/min/{1.73_m2} (ref >=60)
GFR, Est Non African American: 64 mL/min/{1.73_m2} (ref >=60)
GLUCOSE: 91 mg/dL (ref 65–99)
Globulin: 2.1 g/dL (calc) (ref 1.9–3.7)
Potassium: 4.4 mmol/L (ref 3.5–5.3)
Sodium: 140 mmol/L (ref 135–146)
TOTAL PROTEIN: 6.9 g/dL (ref 6.1–8.1)
Total Bilirubin: 0.6 mg/dL (ref 0.2–1.2)

## 2018-02-16 LAB — LIPID PANEL
Cholesterol: 198 mg/dL (ref <200)
HDL: 55 mg/dL (ref >40)
LDL Cholesterol (Calc): 117 mg/dL (calc) — ABNORMAL HIGH
Non-HDL Cholesterol (Calc): 143 mg/dL (calc) — ABNORMAL HIGH (ref <130)
Total CHOL/HDL Ratio: 3.6 (calc) (ref <5.0)
Triglycerides: 148 mg/dL (ref <150)

## 2018-02-16 LAB — REFLEX PSA, FREE
PSA, % FREE: 13 % — AB (ref >25)
PSA, FREE: 0.6 ng/mL

## 2018-02-16 LAB — CBC WITH DIFFERENTIAL/PLATELET
ABSOLUTE MONOCYTES: 570 {cells}/uL (ref 200–950)
Basophils Absolute: 67 cells/uL (ref 0–200)
Basophils Relative: 1 %
Eosinophils Absolute: 121 cells/uL (ref 15–500)
Eosinophils Relative: 1.8 %
HCT: 50.5 % — ABNORMAL HIGH (ref 38.5–50.0)
Hemoglobin: 17 g/dL (ref 13.2–17.1)
LYMPHS ABS: 1300 {cells}/uL (ref 850–3900)
MCH: 30 pg (ref 27.0–33.0)
MCHC: 33.7 g/dL (ref 32.0–36.0)
MCV: 89.1 fL (ref 80.0–100.0)
MPV: 13 fL — AB (ref 7.5–12.5)
Monocytes Relative: 8.5 %
Neutro Abs: 4643 cells/uL (ref 1500–7800)
Neutrophils Relative %: 69.3 %
PLATELETS: 213 10*3/uL (ref 140–400)
RBC: 5.67 10*6/uL (ref 4.20–5.80)
RDW: 12.3 % (ref 11.0–15.0)
TOTAL LYMPHOCYTE: 19.4 %
WBC: 6.7 10*3/uL (ref 3.8–10.8)

## 2018-02-16 LAB — HEMOGLOBIN A1C
EAG (MMOL/L): 5.7 (calc)
Hgb A1c MFr Bld: 5.2 % of total Hgb (ref <5.7)
Mean Plasma Glucose: 103 (calc)

## 2018-02-16 LAB — PSA, TOTAL WITH REFLEX TO PSA, FREE: PSA, TOTAL: 4.7 ng/mL — AB (ref <=4.0)

## 2018-02-18 ENCOUNTER — Other Ambulatory Visit: Payer: Medicare HMO

## 2018-02-23 ENCOUNTER — Encounter: Payer: Medicare HMO | Admitting: Family Medicine

## 2018-02-25 ENCOUNTER — Other Ambulatory Visit: Payer: Self-pay | Admitting: Family Medicine

## 2018-02-25 ENCOUNTER — Encounter: Payer: Self-pay | Admitting: Family Medicine

## 2018-02-25 ENCOUNTER — Ambulatory Visit (INDEPENDENT_AMBULATORY_CARE_PROVIDER_SITE_OTHER): Payer: Medicare HMO | Admitting: Family Medicine

## 2018-02-25 VITALS — BP 137/79 | HR 87 | Temp 97.7°F | Resp 16 | Ht 67.0 in | Wt 167.0 lb

## 2018-02-25 DIAGNOSIS — N401 Enlarged prostate with lower urinary tract symptoms: Secondary | ICD-10-CM | POA: Diagnosis not present

## 2018-02-25 DIAGNOSIS — Z0000 Encounter for general adult medical examination without abnormal findings: Secondary | ICD-10-CM

## 2018-02-25 DIAGNOSIS — R972 Elevated prostate specific antigen [PSA]: Secondary | ICD-10-CM

## 2018-02-25 DIAGNOSIS — Z87891 Personal history of nicotine dependence: Secondary | ICD-10-CM

## 2018-02-25 DIAGNOSIS — E785 Hyperlipidemia, unspecified: Secondary | ICD-10-CM | POA: Diagnosis not present

## 2018-02-25 DIAGNOSIS — J3089 Other allergic rhinitis: Secondary | ICD-10-CM

## 2018-02-25 DIAGNOSIS — M069 Rheumatoid arthritis, unspecified: Secondary | ICD-10-CM | POA: Diagnosis not present

## 2018-02-25 DIAGNOSIS — J432 Centrilobular emphysema: Secondary | ICD-10-CM

## 2018-02-25 DIAGNOSIS — G8929 Other chronic pain: Secondary | ICD-10-CM

## 2018-02-25 DIAGNOSIS — J302 Other seasonal allergic rhinitis: Secondary | ICD-10-CM | POA: Diagnosis not present

## 2018-02-25 DIAGNOSIS — R3912 Poor urinary stream: Secondary | ICD-10-CM

## 2018-02-25 DIAGNOSIS — M545 Low back pain, unspecified: Secondary | ICD-10-CM

## 2018-02-25 MED ORDER — FLUTICASONE PROPIONATE 50 MCG/ACT NA SUSP: 2.0000 | Freq: Every day | NASAL | 1 refills | Status: DC

## 2018-02-25 MED ORDER — ROSUVASTATIN CALCIUM 10 MG PO TABS: 10.0000 mg | ORAL_TABLET | ORAL | 3 refills | Status: DC

## 2018-02-25 MED ORDER — CYCLOBENZAPRINE HCL 5 MG PO TABS: 5.0000 mg | ORAL_TABLET | Freq: Every evening | ORAL | 5 refills | Status: DC | PRN

## 2018-02-25 NOTE — Assessment & Plan Note (Signed)
Elevated PSA 3.8 up to 4.7, mild low free % Fam history now reported, younger brother prostate CA mild BPH symptoms stable  Plan Counseling on PSA utility in diagnosing prostate cancer / screening - agree to repeat PSA lab in 6 months, if abnormal consider DRE vs refer to Urology

## 2018-02-25 NOTE — Patient Instructions (Addendum)
Thank you for coming to the office today.  Improved cholesterol. Continue Rosuvastatin cholesterol med twice weekly for now. Refilled.  Continue allergy meds and nasal sprays as you are.  Start Cyclobenzapine (Flexeril) 5mg  tablets (muscle relaxant) - start with one whole pill at night for muscle relaxant - may make you sedated or sleepy (be careful driving or working on this) if tolerated you can take half if preferred or can increase in future as needed  Elevated PSA Prostate lab test. We can re-check again in 6 months.  DUE for FASTING BLOOD WORK (no food or drink after midnight before the lab appointment, only water or coffee without cream/sugar on the morning of)  SCHEDULE "Lab Only" visit in the morning at the clinic for lab draw in 6 MONTHS   - Make sure Lab Only appointment is at about 1 week before your next appointment, so that results will be available  For Lab Results, once available within 2-3 days of blood draw, you can can log in to MyChart online to view your results and a brief explanation. Also, we can discuss results at next follow-up visit.   Please schedule a Follow-up Appointment to: Return in about 6 months (around 08/26/2018) for 6 months for lab results - PSA, OA Back Pain, COPD.  If you have any other questions or concerns, please feel free to call the office or send a message through [place]. You may also schedule an earlier appointment if necessary.  Additionally, you may be receiving a survey about your experience at our office within a few days to 1 week by e-mail or mail. We value your feedback.  Nobie Putnam, DO [place]

## 2018-02-25 NOTE — Assessment & Plan Note (Signed)
Stable with occasional episodic flares w/ inc repetitive activity  Continue infrequent NSAID for now - Aleve OTC intermittent only, he is aware of risks on these meds  For muscle stiffness/tightness related, can try rx Flexeril 5mg QHS PRN 

## 2018-02-25 NOTE — Progress Notes (Signed)
Subjective:    Patient ID: Derek Mayo, male    DOB: 09-Nov-1945, 73 y.o.   MRN: [id number]  Derek Mayo is a 73 y.o. male presenting on 02/25/2018 for Annual Exam   HPI  Here for Annual Physical and Lab Review:  Centrilobular Emphysema COPD Severe Stage IV  // Allergic Rhinitis Followed by [facility] Dr Raul Del (last seen 09/2017) continued on current regimen with Incruse, Albuterol, Flovent, and Flonase, Nasal Saline. - Today he reports doing well. No recent flare - He uses Azelastine usually at night PRN. He uses ventolin PRN. He will use Flovent and Flonase daily. He also has Atrovent decongestant spray PRN. He will consider Pseudophed PRN only if need decongestant - He will follow-up with Dr Raul Del q 6 months, 03/2018  Osteoarthritis, multiple joints / Back Pain See prior notes for background, he has multiple joints with OA/DJD and arthritic and muscle pain - Today he reports some recent worse muscle stiffness and "locking up" in muscles of his low back L > R, he has tried a muscle relaxant flexeril 5mg  in past 2017 from ED with good results, he is using topical for some relief on back, and taking OTC rarely Naproxen and Tylenol, only mild relief  PROSTATE SCREENING / Elevated PSA / Mild BPH Last PSA up to 4.7 and slightly low free %, previously had PSA 3.8 (01/2017). Currently with mild BPH LUTS, see AUA score. Now reports KNOWN family history of prostate CA, younger brother dx with mild prostate cancer s/p treated. - He admits to some nocturia occasionally only 1x nightly usually after 6 hours, only worse if drink coffee/beer, and daytime frequency if drink either of these, otherwise no problem. - No formal dx of BPH in past - Never on medications  HYPERLIPIDEMIA / Overweight BMI >26 - Last visit with me 07/2017, previously off Atorvastatin due to myalgia, then switch to Rosuvastatin 10mg  TIW - had some return of myalgia, then settled on TWICE a week tolerating  better, see background notes for information. - Interval update with lipid panel 01/2018, significantly improved on Rosuva twice weekly - Today patient reports doing well on Rosuvastatin twice weekly, due for refill  Elevated Calcium Recent labs show Calcium 10.4 over past 6-12 months. Last labs 10.4 and 10.4 He admits taking TUMs regularly. No new concerns or symptoms  UNCHANGED from last score 01/2017 AUA BPH Symptom Score over past 1 month 1. Sensation of not emptying bladder post void - 2 2. Urinate less than 2 hour after finish last void - 0 3. Start/Stop several times during void - 0 4. Difficult to postpone urination - 0 5. Weak urinary stream - 3 6. Push or strain urination - 0 7. Nocturia - 1 times  Total Score: 6 (Mild BPH symptoms)  Additionally - he reports that he is 100% disabled now through [place].  Health Maintenance: Lung CA Screening: Last LDCT 03/12/17 - negative, advised repeat in 12 months. He is asymptomatic. Age 54, long history of smoking >40 pack years, ready for repeat scan. Will send message to Burgess Estelle RN for this.  UTD Flu Vaccine, 11/2017 UTD Pneumonia vaccines, completed entire series UTD AAA Screening ultrasound, negative 08/2016  Depression screen [facility] 2/9 02/25/2018 08/21/2017 08/21/2017  Decreased Interest 0 0 0  Down, Depressed, Hopeless 0 0 0  PHQ - 2 Score 0 0 0  Altered sleeping - 0 -  Tired, decreased energy - 0 -  Change in appetite - 0 -  Feeling bad or  failure about yourself  - 0 -  Trouble concentrating - 0 -  Moving slowly or fidgety/restless - 0 -  Suicidal thoughts - 0 -  PHQ-9 Score - 0 -  Difficult doing work/chores - Not difficult at all -    Past Medical History:  Diagnosis Date  . Allergic rhinitis   . Anemia   . Chicken pox   . COPD (chronic obstructive pulmonary disease) ([place])   . Depression   . Measles   . Mumps   . Premature beats    Past Surgical History:  Procedure Laterality Date  . CHEST SURGERY    .  COLONOSCOPY WITH PROPOFOL N/A 06/25/2015   Procedure: COLONOSCOPY WITH PROPOFOL;  Surgeon: Josefine Class, MD;  Location: [facility] ENDOSCOPY;  Service: Endoscopy;  Laterality: N/A;  . KNEE SURGERY Left    removed broken cartilage  . TONSILECTOMY/ADENOIDECTOMY WITH MYRINGOTOMY    . TONSILLECTOMY     Social History   Socioeconomic History  . Marital status: Married    Spouse name: Not on file  . Number of children: Not on file  . Years of education: Not on file  . Highest education level: Not on file  Occupational History  . Not on file  Social Needs  . Financial resource strain: Not on file  . Food insecurity:    Worry: Not on file    Inability: Not on file  . Transportation needs:    Medical: Not on file    Non-medical: Not on file  Tobacco Use  . Smoking status: Former Smoker    Packs/day: 1.00    Years: 40.00    Pack years: 40.00    Types: Cigars, Cigarettes    Last attempt to quit: 08/13/2012    Years since quitting: 5.5  . Smokeless tobacco: Never Used  Substance and Sexual Activity  . Alcohol use: Yes    Alcohol/week: 1.0 standard drinks    Types: 1 Cans of beer per week  . Drug use: Yes    Types: Other-see comments    Comment: socially only  . Sexual activity: Not on file  Lifestyle  . Physical activity:    Days per week: Not on file    Minutes per session: Not on file  . Stress: Not on file  Relationships  . Social connections:    Talks on phone: Not on file    Gets together: Not on file    Attends religious service: Not on file    Active member of club or organization: Not on file    Attends meetings of clubs or organizations: Not on file    Relationship status: Not on file  . Intimate partner violence:    Fear of current or ex partner: Not on file    Emotionally abused: Not on file    Physically abused: Not on file    Forced sexual activity: Not on file  Other Topics Concern  . Not on file  Social History Narrative  . Not on file   Family  History  Problem Relation Age of Onset  . Heart disease Father   . AAA (abdominal aortic aneurysm) Brother   . Prostate cancer Brother    Current Outpatient Medications on File Prior to Visit  Medication Sig  . albuterol (PROVENTIL) (2.5 MG/3ML) 0.083% nebulizer solution Inhale 0.083 mLs into the lungs every other day.  . albuterol (VENTOLIN HFA) 108 (90 BASE) MCG/ACT inhaler INHALE 2 PUFFS 4 TIMES A DAY AS NEEDED  .  aspirin EC 81 MG tablet Take 81 mg by mouth daily.  . Azelastine HCl 0.15 % SOLN Place 1 spray into both nostrils daily as needed.  . fluticasone (FLOVENT HFA) 110 MCG/ACT inhaler INHALE 1 PUFF TWICE A DAY  . ipratropium (ATROVENT) 0.03 % nasal spray Place 1 spray into both nostrils daily as needed.  . naproxen (NAPROSYN) 250 MG tablet Take 250 mg by mouth daily as needed.  Marland Kitchen PAZEO 0.7 % SOLN Place 1 drop into both eyes daily as needed.  . pseudoephedrine (SUDAFED) 30 MG tablet Take 1 tablet (30 mg total) by mouth daily.  Marland Kitchen umeclidinium bromide (INCRUSE ELLIPTA) 62.5 MCG/INH AEPB INHALE 1 INHALATION INTO THE LUNGS ONCE DAILY.   No current facility-administered medications on file prior to visit.     Review of Systems  Constitutional: Negative for activity change, appetite change, chills, diaphoresis, fatigue and fever.  HENT: Negative for congestion and hearing loss.   Eyes: Negative for visual disturbance.  Respiratory: Negative for apnea, cough, choking, chest tightness, shortness of breath and wheezing.   Cardiovascular: Negative for chest pain, palpitations and leg swelling.  Gastrointestinal: Negative for abdominal pain, anal bleeding, blood in stool, constipation, diarrhea, nausea and vomiting.  Endocrine: Negative for cold intolerance.  Genitourinary: Negative for decreased urine volume, difficulty urinating, dysuria, frequency, hematuria, testicular pain and urgency.  Musculoskeletal: Positive for arthralgias and back pain. Negative for neck pain.  Skin: Negative  for rash.  Allergic/Immunologic: Positive for environmental allergies.  Neurological: Negative for dizziness, weakness, light-headedness, numbness and headaches.  Hematological: Negative for adenopathy.  Psychiatric/Behavioral: Negative for behavioral problems, dysphoric mood and sleep disturbance. The patient is not nervous/anxious.    Per HPI unless specifically indicated above     Objective:    BP 137/79   Pulse 87   Temp 97.7 F (36.5 C) (Oral)   Resp 16   Ht 5\' 7"  (1.702 m)   Wt 167 lb (75.8 kg)   SpO2 100%   BMI 26.16 kg/m   Wt Readings from Last 3 Encounters:  02/25/18 167 lb (75.8 kg)  08/21/17 166 lb 12.8 oz (75.7 kg)  03/11/17 160 lb (72.6 kg)    Physical Exam Vitals signs and nursing note reviewed.  Constitutional:      General: He is not in acute distress.    Appearance: He is well-developed. He is not diaphoretic.     Comments: Well-appearing, comfortable, cooperative  HENT:     Head: Normocephalic and atraumatic.     Comments: Frontal / maxillary sinuses non-tender. Nares patent without purulence or edema. Bilateral TMs clear without erythema, effusion or bulging. Oropharynx clear without erythema, exudates, edema or asymmetry. Eyes:     General:        Right eye: No discharge.        Left eye: No discharge.     Conjunctiva/sclera: Conjunctivae normal.     Pupils: Pupils are equal, round, and reactive to light.  Neck:     Musculoskeletal: Normal range of motion and neck supple.     Thyroid: No thyromegaly.     Comments: No carotid bruits heard Cardiovascular:     Rate and Rhythm: Normal rate and regular rhythm.     Heart sounds: Normal heart sounds. No murmur.  Pulmonary:     Effort: Pulmonary effort is normal. No respiratory distress.     Breath sounds: Normal breath sounds. No wheezing or rales.     Comments: Good air movement. No coughing. Abdominal:  General: Bowel sounds are normal. There is no distension.     Palpations: Abdomen is soft.  There is no mass.     Tenderness: There is no abdominal tenderness.  Musculoskeletal: Normal range of motion.        General: No tenderness.     Comments: Upper / Lower Extremities: - Normal muscle tone, strength bilateral upper extremities 5/5, lower extremities 5/5  Lymphadenopathy:     Cervical: No cervical adenopathy.  Skin:    General: Skin is warm and dry.     Findings: No erythema or rash.  Neurological:     Mental Status: He is alert and oriented to person, place, and time.     Comments: Distal sensation intact to light touch all extremities  Psychiatric:        Behavior: Behavior normal.     Comments: Well groomed, good eye contact, normal speech and thoughts    Results for orders placed or performed in visit on 02/15/18  PSA, Total with Reflex to PSA, Free  Result Value Ref Range   PSA, Total 4.7 (H) < OR = 4.0 ng/mL  Lipid panel  Result Value Ref Range   Cholesterol 198 <200 mg/dL   HDL 55 >40 mg/dL   Triglycerides 148 <150 mg/dL   LDL Cholesterol (Calc) 117 (H) mg/dL (calc)   Total CHOL/HDL Ratio 3.6 <5.0 (calc)   Non-HDL Cholesterol (Calc) 143 (H) <130 mg/dL (calc)  COMPLETE METABOLIC PANEL WITH GFR  Result Value Ref Range   Glucose, Bld 91 65 - 99 mg/dL   BUN 16 7 - 25 mg/dL   Creat 1.15 0.70 - 1.18 mg/dL   GFR, Est Non African American 64 > OR = 60 mL/min/1.63m2   GFR, Est African American 74 > OR = 60 mL/min/1.4m2   BUN/Creatinine Ratio NOT APPLICABLE 6 - 22 (calc)   Sodium 140 135 - 146 mmol/L   Potassium 4.4 3.5 - 5.3 mmol/L   Chloride 102 98 - 110 mmol/L   CO2 29 20 - 32 mmol/L   Calcium 10.4 (H) 8.6 - 10.3 mg/dL   Total Protein 6.9 6.1 - 8.1 g/dL   Albumin 4.8 3.6 - 5.1 g/dL   Globulin 2.1 1.9 - 3.7 g/dL (calc)   AG Ratio 2.3 1.0 - 2.5 (calc)   Total Bilirubin 0.6 0.2 - 1.2 mg/dL   Alkaline phosphatase (APISO) 56 40 - 115 U/L   AST 21 10 - 35 U/L   ALT 15 9 - 46 U/L  CBC with Differential/Platelet  Result Value Ref Range   WBC 6.7 3.8 - 10.8  Thousand/uL   RBC 5.67 4.20 - 5.80 Million/uL   Hemoglobin 17.0 13.2 - 17.1 g/dL   HCT 50.5 (H) 38.5 - 50.0 %   MCV 89.1 80.0 - 100.0 fL   MCH 30.0 27.0 - 33.0 pg   MCHC 33.7 32.0 - 36.0 g/dL   RDW 12.3 11.0 - 15.0 %   Platelets 213 140 - 400 Thousand/uL   MPV 13.0 (H) 7.5 - 12.5 fL   Neutro Abs 4,643 1,500 - 7,800 cells/uL   Lymphs Abs 1,300 850 - 3,900 cells/uL   Absolute Monocytes 570 200 - 950 cells/uL   Eosinophils Absolute 121 15 - 500 cells/uL   Basophils Absolute 67 0 - 200 cells/uL   Neutrophils Relative % 69.3 %   Total Lymphocyte 19.4 %   Monocytes Relative 8.5 %   Eosinophils Relative 1.8 %   Basophils Relative 1.0 %  Hemoglobin A1c  Result Value Ref Range   Hgb A1c MFr Bld 5.2 <5.7 % of total Hgb   Mean Plasma Glucose 103 (calc)   eAG (mmol/L) 5.7 (calc)  reflex PSA, Free  Result Value Ref Range   PSA, Free 0.6 ng/mL   PSA, % Free 13 (L) >25 % (calc)      Assessment & Plan:   Problem List Items Addressed This Visit    Allergic rhinitis, seasonal    Stable without flare Continue current regimen for allergies, sinuses, and COPD - using flonase regularly will refill, others are PRN      Benign prostatic hyperplasia with weak urinary stream    Clinically mild BPH symptoms, see AUA score Not on medication, remains stable Elevated PSA now - see A&P  Follow-up 6 months      Centrilobular emphysema (HCC)    Stable without exacerbation Followed by [facility] Dr Donell Beers on Low Dose Lung CA Screen CT yearly - will be due for repeat now 02/2018 Former smoker, now second hand smoke  Plan: 1. Follow-up with Dr Raul Del as planned - Continues on Incruse Ellipta, Flovent, Albuterol - Continue sinus treatments - refilled Flonase, Atrovent, Azelastine sprays  Message sent to Burgess Estelle RN to repeat yearly LDCT 02/2018      Relevant Medications   fluticasone (FLONASE) 50 MCG/ACT nasal spray   Dyslipidemia    Significantly improved lipid panel on  intermittent statin twice weekly now Last lipid panel 01/2018 Failed Atorvastatin 10mg  myalgias Calculated ASCVD 10 yr risk score 15-19%, former smoker  Plan: 1. Continue Rosuvastatin 10mg  TWICE weekly - highest tolerated dose, failed 3 times weekly 2. Continue ASA 81mg  for primary ASCVD risk reduction 3. Encourage improved lifestyle - low carb/cholesterol, reduce portion size, continue improving regular exercise 4. Follow-up yearly lipids      Relevant Medications   rosuvastatin (CRESTOR) 10 MG tablet   Elevated PSA, less than 10 ng/ml    Elevated PSA 3.8 up to 4.7, mild low free % Fam history now reported, younger brother prostate CA mild BPH symptoms stable  Plan Counseling on PSA utility in diagnosing prostate cancer / screening - agree to repeat PSA lab in 6 months, if abnormal consider DRE vs refer to Urology      Former smoker    Remains smoke free 2nd hand smoke COPD Refer to repeat lung CT yearly      Rheumatoid arthritis involving both hands ([place])    Stable with occasional episodic flares w/ inc repetitive activity  Continue infrequent NSAID for now - Aleve OTC intermittent only, he is aware of risks on these meds  For muscle stiffness/tightness related, can try rx Flexeril 5mg  QHS PRN      Relevant Medications   cyclobenzaprine (FLEXERIL) 5 MG tablet    Other Visit Diagnoses    Annual physical exam    -  Primary  Updated Health Maintenance information Reviewed recent lab results with patient Encouraged improvement to lifestyle with diet and exercise    Chronic seasonal allergic rhinitis       Relevant Medications   fluticasone (FLONASE) 50 MCG/ACT nasal spray   Chronic bilateral low back pain without sciatica      See above RA Secondary OA/DJD lumbar spine with some chronic back pain and MSK symptoms  Will try Flexeril 5mg  low dose QHS PRN - caution sedation, has helped him before Continue other supportive care Follow-up    Relevant Medications     cyclobenzaprine (FLEXERIL) 5 MG tablet  Meds ordered this encounter  Medications  . rosuvastatin (CRESTOR) 10 MG tablet    Sig: Take 1 tablet (10 mg total) by mouth 2 (two) times a week.    Dispense:  24 tablet    Refill:  3    Added refills, twice week dosing up to 24 per month  . fluticasone (FLONASE) 50 MCG/ACT nasal spray    Sig: Place 2 sprays into both nostrils daily.    Dispense:  48 g    Refill:  1    Dx J30.2  . cyclobenzaprine (FLEXERIL) 5 MG tablet    Sig: Take 1 tablet (5 mg total) by mouth at bedtime as needed for muscle spasms.    Dispense:  30 tablet    Refill:  5    Follow up plan: Return in about 6 months (around 08/26/2018) for 6 months for lab results - PSA, OA Back Pain, COPD.  Future lab for PSA ordered for 08/18/18  Nobie Putnam, [facility] 02/25/2018, 9:09 AM

## 2018-02-25 NOTE — Assessment & Plan Note (Signed)
Stable without exacerbation Followed by [facility] Dr Donell Beers on [place] CT yearly - will be due for repeat now 02/2018 Former smoker, now second hand smoke  Plan: 1. Follow-up with Dr Raul Del as planned - Continues on Incruse Ellipta, Flovent, Albuterol - Continue sinus treatments - refilled Flonase, Atrovent, Azelastine sprays  Message sent to Burgess Estelle RN to repeat yearly LDCT 02/2018

## 2018-02-25 NOTE — Assessment & Plan Note (Signed)
Significantly improved lipid panel on intermittent statin twice weekly now Last lipid panel 01/2018 Failed Atorvastatin 10mg  myalgias Calculated ASCVD 10 yr risk score 15-19%, former smoker  Plan: 1. Continue Rosuvastatin 10mg  TWICE weekly - highest tolerated dose, failed 3 times weekly 2. Continue ASA 81mg  for primary ASCVD risk reduction 3. Encourage improved lifestyle - low carb/cholesterol, reduce portion size, continue improving regular exercise 4. Follow-up yearly lipids

## 2018-02-25 NOTE — Assessment & Plan Note (Signed)
Stable without flare Continue current regimen for allergies, sinuses, and COPD - using flonase regularly will refill, others are PRN

## 2018-02-25 NOTE — Assessment & Plan Note (Signed)
Clinically mild BPH symptoms, see AUA score Not on medication, remains stable Elevated PSA now - see A&P  Follow-up 6 months

## 2018-02-25 NOTE — Assessment & Plan Note (Signed)
Remains smoke free 2nd hand smoke COPD Refer to repeat lung CT yearly

## 2018-02-27 ENCOUNTER — Telehealth: Payer: Self-pay

## 2018-02-27 NOTE — Telephone Encounter (Signed)
Call pt regarding lung screening. Left message for pt to return call.  

## 2018-03-06 ENCOUNTER — Telehealth: Payer: Self-pay

## 2018-03-06 NOTE — Telephone Encounter (Signed)
Call pt regarding lung screening. Left message for pt to return call.  

## 2018-03-15 ENCOUNTER — Telehealth: Payer: Self-pay | Admitting: *Deleted

## 2018-03-15 ENCOUNTER — Encounter: Payer: Self-pay | Admitting: *Deleted

## 2018-03-15 NOTE — Telephone Encounter (Signed)
Attempted to contact patient r/t LDCT Screening follow up due at this time.  No answer received, message left for patient to call [phone] to schedule appointment.    

## 2018-03-17 ENCOUNTER — Telehealth: Payer: Self-pay | Admitting: *Deleted

## 2018-03-17 DIAGNOSIS — Z87891 Personal history of nicotine dependence: Secondary | ICD-10-CM

## 2018-03-17 DIAGNOSIS — Z122 Encounter for screening for malignant neoplasm of respiratory organs: Secondary | ICD-10-CM

## 2018-03-17 NOTE — Telephone Encounter (Signed)
Patient has been notified that annual lung cancer screening low dose CT scan is due currently or will be in near future. Confirmed that patient is within the age range of 55-77, and asymptomatic, (no signs or symptoms of lung cancer). Patient denies illness that would prevent curative treatment for lung cancer if found. Verified smoking history, (former, quit 08/13/12, 40 pack year). The shared decision making visit was done 03/11/17. Patient is agreeable for CT scan being scheduled.

## 2018-03-22 ENCOUNTER — Ambulatory Visit
Admission: RE | Admit: 2018-03-22 | Discharge: 2018-03-22 | Disposition: A | Discharge status: Home / Self Care | Payer: Medicare HMO | Source: Ambulatory Visit | Attending: Nurse Practitioner | Admitting: Nurse Practitioner

## 2018-03-22 DIAGNOSIS — Z87891 Personal history of nicotine dependence: Secondary | ICD-10-CM | POA: Insufficient documentation

## 2018-03-22 DIAGNOSIS — Z122 Encounter for screening for malignant neoplasm of respiratory organs: Secondary | ICD-10-CM

## 2018-03-23 ENCOUNTER — Encounter: Payer: Self-pay | Admitting: *Deleted

## 2018-04-19 DIAGNOSIS — J449 Chronic obstructive pulmonary disease, unspecified: Secondary | ICD-10-CM | POA: Diagnosis not present

## 2018-04-29 ENCOUNTER — Other Ambulatory Visit: Payer: Self-pay | Admitting: Family Medicine

## 2018-04-29 DIAGNOSIS — E785 Hyperlipidemia, unspecified: Secondary | ICD-10-CM

## 2018-06-20 ENCOUNTER — Other Ambulatory Visit: Payer: Self-pay | Admitting: Family Medicine

## 2018-06-20 DIAGNOSIS — J302 Other seasonal allergic rhinitis: Secondary | ICD-10-CM

## 2018-08-18 ENCOUNTER — Other Ambulatory Visit: Payer: Medicare HMO

## 2018-08-19 ENCOUNTER — Other Ambulatory Visit: Payer: Self-pay | Admitting: Family Medicine

## 2018-08-19 DIAGNOSIS — J302 Other seasonal allergic rhinitis: Secondary | ICD-10-CM

## 2018-08-25 ENCOUNTER — Ambulatory Visit: Payer: Medicare HMO | Admitting: Family Medicine

## 2018-09-02 ENCOUNTER — Other Ambulatory Visit: Payer: Self-pay | Admitting: Family Medicine

## 2018-09-02 DIAGNOSIS — J302 Other seasonal allergic rhinitis: Secondary | ICD-10-CM

## 2018-10-25 DIAGNOSIS — J449 Chronic obstructive pulmonary disease, unspecified: Secondary | ICD-10-CM | POA: Diagnosis not present

## 2018-10-25 DIAGNOSIS — R0600 Dyspnea, unspecified: Secondary | ICD-10-CM | POA: Diagnosis not present

## 2018-11-10 ENCOUNTER — Other Ambulatory Visit: Payer: Self-pay

## 2018-11-10 ENCOUNTER — Other Ambulatory Visit: Payer: Medicare HMO

## 2018-11-10 ENCOUNTER — Telehealth: Payer: Self-pay

## 2018-11-10 DIAGNOSIS — R972 Elevated prostate specific antigen [PSA]: Secondary | ICD-10-CM

## 2018-11-10 NOTE — Telephone Encounter (Signed)
Pt on the schedule for a repeat PSA lab test.

## 2018-11-11 LAB — PSA, TOTAL AND FREE
PSA, % Free: 14 % (calc) — ABNORMAL LOW (ref >25)
PSA, Free: 0.5 ng/mL
PSA, Total: 3.5 ng/mL (ref <=4.0)

## 2018-11-17 ENCOUNTER — Other Ambulatory Visit: Payer: Self-pay | Admitting: Family Medicine

## 2018-11-17 ENCOUNTER — Other Ambulatory Visit: Payer: Self-pay

## 2018-11-17 ENCOUNTER — Ambulatory Visit (INDEPENDENT_AMBULATORY_CARE_PROVIDER_SITE_OTHER): Payer: Medicare HMO | Admitting: Family Medicine

## 2018-11-17 ENCOUNTER — Encounter: Payer: Self-pay | Admitting: Family Medicine

## 2018-11-17 VITALS — BP 132/70 | HR 100 | Resp 16 | Ht 67.0 in | Wt 154.0 lb

## 2018-11-17 DIAGNOSIS — N401 Enlarged prostate with lower urinary tract symptoms: Secondary | ICD-10-CM

## 2018-11-17 DIAGNOSIS — J432 Centrilobular emphysema: Secondary | ICD-10-CM

## 2018-11-17 DIAGNOSIS — R972 Elevated prostate specific antigen [PSA]: Secondary | ICD-10-CM

## 2018-11-17 DIAGNOSIS — J302 Other seasonal allergic rhinitis: Secondary | ICD-10-CM

## 2018-11-17 DIAGNOSIS — R3912 Poor urinary stream: Secondary | ICD-10-CM | POA: Diagnosis not present

## 2018-11-17 DIAGNOSIS — R7309 Other abnormal glucose: Secondary | ICD-10-CM

## 2018-11-17 DIAGNOSIS — E785 Hyperlipidemia, unspecified: Secondary | ICD-10-CM

## 2018-11-17 DIAGNOSIS — Z0000 Encounter for general adult medical examination without abnormal findings: Secondary | ICD-10-CM

## 2018-11-17 DIAGNOSIS — Z23 Encounter for immunization: Secondary | ICD-10-CM

## 2018-11-17 MED ORDER — FLUTICASONE PROPIONATE 50 MCG/ACT NA SUSP: NASAL | 1 refills | Status: DC

## 2018-11-17 MED ORDER — ROSUVASTATIN CALCIUM 10 MG PO TABS: 10.0000 mg | ORAL_TABLET | ORAL | 1 refills | Status: DC

## 2018-11-17 MED ORDER — IPRATROPIUM BROMIDE 0.03 % NA SOLN: 1.0000 | Freq: Every day | NASAL | 1 refills | Status: DC | PRN

## 2018-11-17 MED ORDER — AZELASTINE HCL 0.15 % NA SOLN: 1.0000 | Freq: Every day | NASAL | 1 refills | Status: AC | PRN

## 2018-11-17 NOTE — Assessment & Plan Note (Signed)
Improved PSA down to 3.5 now Fam history now reported, younger brother prostate CA mild BPH symptoms stable  Plan Counseling on PSA utility in diagnosing prostate cancer / screening Reassurance WIll follow up repeat PSA in 6 months as planned

## 2018-11-17 NOTE — Progress Notes (Signed)
Subjective:    Patient ID: Derek Mayo, male    DOB: 12-08-1945, 73 y.o.   MRN: [id number]  Derek Mayo is a 73 y.o. male presenting on 11/17/2018 for Elevated PSA (6 mo f/u)   HPI   Centrilobular Emphysema COPD SevereStage IV // Allergic Rhinitis Followed by [facility] Dr Raul Del (last seen 09/2018) continued on current regimen with Incruse, Albuterol, Flovent, and Flonase, Nasal Saline. - Today he reports doing well. No recent flare. - He uses Azelastine usually at night PRN. He uses ventolin PRN. He will use Flovent and Flonase daily. He also has Atrovent decongestant spray PRN. He will consider Pseudophed PRN only if need decongestant - He will follow-up with Dr Raul Del q 6 months - He has prior history of H1N1 in past - uses oxygen 0.5 to 1L, if wearing mask for too long - He remains very active and trying to maintain his immune system - Pulse ox 96-97% avg - Denies any worsening cough sputum, wheezing, fever chills  PROSTATE SCREENING / Elevated PSA / Mild BPH Previous PSAup to 4.7 and slightly low free % Now last lab shows PSA 3.5 improved result . Currentlywith mildBPH LUTS, see AUA score.Now reports KNOWN family history of prostate CA, younger brother dx with mild prostate cancer s/p treated. - He admits to some nocturia occasionally only 1x nightly usually after 6 hours, only worse if drink coffee/beer, and daytime frequency if drink either of these, otherwise no problem. - No formal dx of BPH in past - Never on medications  HYPERLIPIDEMIA: - Reports no concerns - Currently taking Rosuvastatin 10mg  x 2 weekly, tolerating well without side effects or myalgias   Health Maintenance: Due for Flu Shot, will receive today    Depression screen [facility] 2/9 11/17/2018 02/25/2018 08/21/2017  Decreased Interest 0 0 0  Down, Depressed, Hopeless 0 0 0  PHQ - 2 Score 0 0 0  Altered sleeping 0 - 0  Tired, decreased energy 0 - 0  Change in appetite 0 - 0  Feeling  bad or failure about yourself  0 - 0  Trouble concentrating 0 - 0  Moving slowly or fidgety/restless 0 - 0  Suicidal thoughts 0 - 0  PHQ-9 Score 0 - 0  Difficult doing work/chores - - Not difficult at all    Social History   Tobacco Use  . Smoking status: Former Smoker    Packs/day: 1.00    Years: 40.00    Pack years: 40.00    Types: Cigars, Cigarettes    Quit date: 08/13/2012    Years since quitting: 6.2  . Smokeless tobacco: Never Used  Substance Use Topics  . Alcohol use: Yes    Alcohol/week: 1.0 standard drinks    Types: 1 Cans of beer per week  . Drug use: Yes    Types: Other-see comments    Comment: socially only    Review of Systems Per HPI unless specifically indicated above     Objective:    BP 132/70 (BP Location: Left Arm, Cuff Size: Normal)   Pulse 100   Resp 16   Ht 5\' 7"  (1.702 m)   Wt 154 lb (69.9 kg)   SpO2 100%   BMI 24.12 kg/m   Wt Readings from Last 3 Encounters:  11/17/18 154 lb (69.9 kg)  03/22/18 157 lb (71.2 kg)  02/25/18 167 lb (75.8 kg)    Physical Exam Vitals signs and nursing note reviewed.  Constitutional:      General: He  is not in acute distress.    Appearance: He is well-developed. He is not diaphoretic.     Comments: Well-appearing, comfortable, cooperative  HENT:     Head: Normocephalic and atraumatic.  Eyes:     General:        Right eye: No discharge.        Left eye: No discharge.     Conjunctiva/sclera: Conjunctivae normal.  Neck:     Musculoskeletal: Normal range of motion and neck supple.     Thyroid: No thyromegaly.  Cardiovascular:     Rate and Rhythm: Normal rate and regular rhythm.     Heart sounds: Normal heart sounds. No murmur.  Pulmonary:     Effort: Pulmonary effort is normal. No respiratory distress.     Breath sounds: Normal breath sounds. No wheezing or rales.     Comments: Mild reduced air movement at baseline, no focal wheezing or problem.  Temporarily on 0.5 to 1L oxygen during visit  Musculoskeletal: Normal range of motion.  Lymphadenopathy:     Cervical: No cervical adenopathy.  Skin:    General: Skin is warm and dry.     Findings: No erythema or rash.  Neurological:     Mental Status: He is alert and oriented to person, place, and time.  Psychiatric:        Behavior: Behavior normal.     Comments: Well groomed, good eye contact, normal speech and thoughts      Results for orders placed or performed in visit on 11/10/18  PSA, total and free  Result Value Ref Range   PSA, Total 3.5 < OR = 4.0 ng/mL   PSA, Free 0.5 ng/mL   PSA, % Free 14 (L) >25 % (calc)      Assessment & Plan:   Problem List Items Addressed This Visit    Benign prostatic hyperplasia with weak urinary stream   Centrilobular emphysema ([place]) - Primary    Stable without exacerbation Followed by [facility] Dr Donell Beers on [address] yearly Former smoker, now second hand smoke  Plan: 1. Follow-up with Dr Raul Del as planned - Continues on Incruse Ellipta, Flovent, Albuterol - Continue sinus treatments - refilled Flonase, Atrovent, Azelastine sprays      Relevant Medications   Azelastine HCl 0.15 % SOLN   fluticasone (FLONASE) 50 MCG/ACT nasal spray   ipratropium (ATROVENT) 0.03 % nasal spray   Dyslipidemia    Significantly improved lipid panel on intermittent statin twice weekly now Failed Atorvastatin 10mg  myalgias Calculated ASCVD 10 yr risk score 15-19%, former smoker  Plan: 1. Continue Rosuvastatin 10mg  TWICE weekly - highest tolerated dose, failed 3 times weekly 2. Continue ASA 81mg  for primary ASCVD risk reduction 3. Encourage improved lifestyle - low carb/cholesterol, reduce portion size, continue improving regular exercise 4. Follow-up yearly lipids in 04/2019      Relevant Medications   rosuvastatin (CRESTOR) 10 MG tablet (Start on 11/18/2018)   Elevated PSA, less than 10 ng/ml    Improved PSA down to 3.5 now Fam history now reported, younger brother  prostate CA mild BPH symptoms stable  Plan Counseling on PSA utility in diagnosing prostate cancer / screening Reassurance WIll follow up repeat PSA in 6 months as planned       Other Visit Diagnoses    Chronic seasonal allergic rhinitis       Relevant Medications   Azelastine HCl 0.15 % SOLN   fluticasone (FLONASE) 50 MCG/ACT nasal spray  ipratropium (ATROVENT) 0.03 % nasal spray   Needs flu shot       Relevant Orders   Flu Vaccine QUAD High Dose(Fluad)      Meds ordered this encounter  Medications  . rosuvastatin (CRESTOR) 10 MG tablet    Sig: Take 1 tablet (10 mg total) by mouth 2 (two) times a week.    Dispense:  24 tablet    Refill:  1  . Azelastine HCl 0.15 % SOLN    Sig: Place 1 spray into both nostrils daily as needed.    Dispense:  90 mL    Refill:  1  . fluticasone (FLONASE) 50 MCG/ACT nasal spray    Sig: SPRAY 2 SPRAYS INTO EACH NOSTRIL EVERY DAY    Dispense:  48 mL    Refill:  1  . ipratropium (ATROVENT) 0.03 % nasal spray    Sig: Place 1 spray into both nostrils daily as needed.    Dispense:  90 mL    Refill:  1     Follow up plan: Return in about 6 months (around 05/17/2019) for Annual Physical.  Future labs ordered for 04/2019   Nobie Putnam, DO [facility] 11/17/2018, 8:24 AM

## 2018-11-17 NOTE — Patient Instructions (Addendum)
Thank you for coming to the office today.  Good PSA result 3.5. We will keep track of this result every 6 month.  Flu shot today  Follow up with Dr Raul Del as planned.  DUE for FASTING BLOOD WORK (no food or drink after midnight before the lab appointment, only water or coffee without cream/sugar on the morning of)  SCHEDULE "Lab Only" visit in the morning at the clinic for lab draw in 6 MONTHS   - Make sure Lab Only appointment is at about 1 week before your next appointment, so that results will be available  For Lab Results, once available within 2-3 days of blood draw, you can can log in to MyChart online to view your results and a brief explanation. Also, we can discuss results at next follow-up visit.   Please schedule a Follow-up Appointment to: Return in about 6 months (around 05/17/2019) for Annual Physical.  If you have any other questions or concerns, please feel free to call the office or send a message through [place]. You may also schedule an earlier appointment if necessary.  Additionally, you may be receiving a survey about your experience at our office within a few days to 1 week by e-mail or mail. We value your feedback.  Nobie Putnam, DO [place]

## 2018-11-17 NOTE — Assessment & Plan Note (Signed)
Stable without exacerbation Followed by [facility] Dr Donell Beers on [address] yearly Former smoker, now second hand smoke  Plan: 1. Follow-up with Dr Raul Del as planned - Continues on Incruse Ellipta, Flovent, Albuterol - Continue sinus treatments - refilled Flonase, Atrovent, Azelastine sprays

## 2018-11-17 NOTE — Assessment & Plan Note (Signed)
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 Continue Rosuvastatin 10mg  TWICE weekly - highest tolerated dose, failed 3 times weekly 2. Continue ASA 81mg  for primary ASCVD risk reduction 3. Encourage improved lifestyle - low carb/cholesterol, reduce portion size, continue improving regular exercise 4. Follow-up yearly lipids in 04/2019

## 2019-03-21 ENCOUNTER — Telehealth: Payer: Self-pay | Admitting: *Deleted

## 2019-03-21 NOTE — Telephone Encounter (Signed)
Contacted in attempt to schedule lung screening scan. Patient requests to wait until summer due to covid concerns.

## 2019-04-25 DIAGNOSIS — R0600 Dyspnea, unspecified: Secondary | ICD-10-CM | POA: Diagnosis not present

## 2019-04-25 DIAGNOSIS — J449 Chronic obstructive pulmonary disease, unspecified: Secondary | ICD-10-CM | POA: Diagnosis not present

## 2019-04-25 DIAGNOSIS — Z9981 Dependence on supplemental oxygen: Secondary | ICD-10-CM | POA: Diagnosis not present

## 2019-05-18 ENCOUNTER — Other Ambulatory Visit: Payer: Medicare HMO

## 2019-05-24 ENCOUNTER — Ambulatory Visit (INDEPENDENT_AMBULATORY_CARE_PROVIDER_SITE_OTHER): Payer: Medicare HMO

## 2019-05-24 DIAGNOSIS — Z0000 Encounter for general adult medical examination without abnormal findings: Secondary | ICD-10-CM | POA: Diagnosis not present

## 2019-05-24 NOTE — Progress Notes (Signed)
Subjective:   Derek Mayo is a 74 y.o. male who presents for Medicare Annual/Subsequent preventive examination.  This visit is being conducted via phone call  - after an attmept to do on video chat - due to the COVID-19 pandemic. This patient has given me verbal consent via phone to conduct this visit, patient states they are participating from their home address. Some vital signs may be absent or patient reported.   Patient identification: identified by name, DOB, and current address.    Review of Systems:   Cardiac Risk Factors include: male gender;advanced age (>62men, >77 women);dyslipidemia     Objective:    Vitals: There were no vitals taken for this visit.  There is no height or weight on file to calculate BMI.  Advanced Directives 05/24/2019 08/16/2015 06/25/2015  Does Patient Have a Medical Advance Directive? No No No  Would patient like information on creating a medical advance directive? - No - patient declined information -    Tobacco Social History   Tobacco Use  Smoking Status Former Smoker  . Packs/day: 1.00  . Years: 40.00  . Pack years: 40.00  . Types: Cigars, Cigarettes  . Quit date: 08/13/2012  . Years since quitting: 6.7  Smokeless Tobacco Never Used     Counseling given: Not Answered   Clinical Intake:  Pre-visit preparation completed: Yes  Pain : No/denies pain     Nutritional Risks: None Diabetes: No  How often do you need to have someone help you when you read instructions, pamphlets, or other written materials from your doctor or pharmacy?: 1 - Never  Interpreter Needed?: No  Information entered by :: Karmina Zufall,LPN  Past Medical History:  Diagnosis Date  . Allergic rhinitis   . Anemia   . Chicken pox   . COPD (chronic obstructive pulmonary disease) ([place])   . Depression   . Measles   . Mumps   . Premature beats    Past Surgical History:  Procedure Laterality Date  . CHEST SURGERY    . COLONOSCOPY WITH PROPOFOL N/A  06/25/2015   Procedure: COLONOSCOPY WITH PROPOFOL;  Surgeon: Josefine Class, MD;  Location: [facility] ENDOSCOPY;  Service: Endoscopy;  Laterality: N/A;  . KNEE SURGERY Left    removed broken cartilage  . TONSILECTOMY/ADENOIDECTOMY WITH MYRINGOTOMY    . TONSILLECTOMY     Family History  Problem Relation Age of Onset  . Heart disease Father   . AAA (abdominal aortic aneurysm) Brother   . Prostate cancer Brother    Social History   Socioeconomic History  . Marital status: Married    Spouse name: Not on file  . Number of children: Not on file  . Years of education: Not on file  . Highest education level: Not on file  Occupational History  . Not on file  Tobacco Use  . Smoking status: Former Smoker    Packs/day: 1.00    Years: 40.00    Pack years: 40.00    Types: Cigars, Cigarettes    Quit date: 08/13/2012    Years since quitting: 6.7  . Smokeless tobacco: Never Used  Substance and Sexual Activity  . Alcohol use: Yes    Alcohol/week: 7.0 standard drinks    Types: 7 Cans of beer per week  . Drug use: Not Currently  . Sexual activity: Not on file  Other Topics Concern  . Not on file  Social History Narrative  . Not on file   Social Determinants of Health  Financial Resource Strain:   . Difficulty of Paying Living Expenses:   Food Insecurity:   . Worried About Charity fundraiser in the Last Year:   . Arboriculturist in the Last Year:   Transportation Needs:   . Film/video editor (Medical):   Marland Kitchen Lack of Transportation (Non-Medical):   Physical Activity:   . Days of Exercise per Week:   . Minutes of Exercise per Session:   Stress:   . Feeling of Stress :   Social Connections:   . Frequency of Communication with Friends and Family:   . Frequency of Social Gatherings with Friends and Family:   . Attends Religious Services:   . Active Member of Clubs or Organizations:   . Attends Archivist Meetings:   Marland Kitchen Marital Status:     Outpatient Encounter  Medications as of 05/24/2019  Medication Sig  . albuterol (PROVENTIL) (2.5 MG/3ML) 0.083% nebulizer solution Inhale 0.083 mLs into the lungs every other day.  . albuterol (VENTOLIN HFA) 108 (90 BASE) MCG/ACT inhaler INHALE 2 PUFFS 4 TIMES A DAY AS NEEDED  . aspirin EC 81 MG tablet Take 81 mg by mouth daily.  . Azelastine HCl 0.15 % SOLN Place 1 spray into both nostrils daily as needed.  . fluticasone (FLONASE) 50 MCG/ACT nasal spray SPRAY 2 SPRAYS INTO EACH NOSTRIL EVERY DAY  . fluticasone (FLOVENT HFA) 110 MCG/ACT inhaler INHALE 1 PUFF TWICE A DAY  . ipratropium (ATROVENT) 0.03 % nasal spray Place 1 spray into both nostrils daily as needed.  . naproxen (NAPROSYN) 250 MG tablet Take 250 mg by mouth daily as needed.  . pseudoephedrine (SUDAFED) 30 MG tablet Take 30 mg by mouth every 4 (four) hours as needed for congestion.  . rosuvastatin (CRESTOR) 10 MG tablet Take 1 tablet (10 mg total) by mouth 2 (two) times a week.  . umeclidinium bromide (INCRUSE ELLIPTA) 62.5 MCG/INH AEPB INHALE 1 INHALATION INTO THE LUNGS ONCE DAILY.  . [DISCONTINUED] [employer] NASAL DECONGESTANT 30 MG tablet TAKE 1 TABLET BY MOUTH EVERY DAY  . [DISCONTINUED] cyclobenzaprine (FLEXERIL) 5 MG tablet Take 1 tablet (5 mg total) by mouth at bedtime as needed for muscle spasms. (Patient not taking: Reported on 05/24/2019)  . [DISCONTINUED] PAZEO 0.7 % SOLN Place 1 drop into both eyes daily as needed.   No facility-administered encounter medications on file as of 05/24/2019.    Activities of Daily Living In your present state of health, do you have any difficulty performing the following activities: 05/24/2019 11/17/2018  Hearing? N N  Comment no hearing aids -  Vision? Y N  Comment eyeglasses, [facility] -  Difficulty concentrating or making decisions? N N  Walking or climbing stairs? Y N  Comment SOB -  Dressing or bathing? N N  Doing errands, shopping? N N  Preparing Food and eating ? N -  Using the Toilet? N -  In  the past six months, have you accidently leaked urine? N -  Do you have problems with loss of bowel control? N -  Managing your Medications? N -  Managing your Finances? N -  Housekeeping or managing your Housekeeping? N -  Some recent data might be hidden    Patient Care Team: Olin Hauser, DO as PCP - General (Family Medicine)   Assessment:   This is a routine wellness examination for [facility].  Exercise Activities and Dietary recommendations Current Exercise Habits: The patient does not participate in regular exercise  at present, Exercise limited by: None identified  Goals Addressed   None     Fall Risk: Fall Risk  05/24/2019 11/17/2018 02/25/2018 08/21/2017 02/20/2017  Falls in the past year? 0 0 0 No No  Number falls in past yr: 0 0 - - -  Injury with Fall? 0 0 - - -  Risk for fall due to : - - - - -  Follow up - - Falls evaluation completed - -    FALL RISK PREVENTION PERTAINING TO THE HOME:  Any stairs in or around the home? Yes  If so, are there any without handrails? No   Home free of loose throw rugs in walkways, pet beds, electrical cords, etc? Yes  Adequate lighting in your home to reduce risk of falls? Yes   ASSISTIVE DEVICES UTILIZED TO PREVENT FALLS:  Life alert? No  Use of a cane, walker or w/c? No  Grab bars in the bathroom? yes Shower chair or bench in shower? No  Elevated toilet seat or a handicapped toilet? No   TIMED UP AND GO:  Unable to perform   Depression Screen PHQ 2/9 Scores 05/24/2019 11/17/2018 02/25/2018 08/21/2017  PHQ - 2 Score 0 0 0 0  PHQ- 9 Score - 0 - 0    Cognitive Function        Immunization History  Administered Date(s) Administered  . Fluad Quad(high Dose 65+) 11/17/2018  . Influenza, High Dose Seasonal PF 11/13/2014, 11/12/2015, 11/03/2016, 11/27/2017  . Influenza-Unspecified 11/28/2013  . Moderna SARS-COVID-2 Vaccination 04/30/2019  . Pneumococcal Conjugate-13 02/08/2014  . Pneumococcal Polysaccharide-23  10/08/2012  . Tdap 10/08/2012  . Zoster 01/04/2013    Qualifies for Shingles Vaccine? Yes  Zostavax completed 2014. Due for Shingrix. Education has been provided regarding the importance of this vaccine. Pt has been advised to call insurance company to determine out of pocket expense. Advised may also receive vaccine at local pharmacy or Health Dept. Verbalized acceptance and understanding.  Tdap: up to date   Flu Vaccine: up to date   Pneumococcal Vaccine: up to date   Covid-19 Vaccine: Completed first dose   Screening Tests Health Maintenance  Topic Date Due  . TETANUS/TDAP  10/09/2022  . COLONOSCOPY  06/24/2025  . INFLUENZA VACCINE  Completed  . Hepatitis C Screening  Completed  . PNA vac Low Risk Adult  Completed   Cancer Screenings:  Colorectal Screening: Completed 2017. Repeat every 10 years  Lung Cancer Screening: (Low Dose CT Chest recommended if Age 25-80 years, 30 pack-year currently smoking OR have quit w/in 15years.) does qualify.   Completed 03/22/2018, will schedule in June  Additional Screening:  Hepatitis C Screening: does qualify; Completed 2018  Vision Screening: Recommended annual ophthalmology exams for early detection of glaucoma and other disorders of the eye. Is the patient up to date with their annual eye exam?  Yes  Who is the provider or what is the name of the office in which the pt attends annual eye exams? [facility]    Dental Screening: Recommended annual dental exams for proper oral hygiene  Community Resource Referral:  CRR required this visit?  No        Plan:  I have personally reviewed and addressed the Medicare Annual Wellness questionnaire and have noted the following in the patient's chart:  A. Medical and social history B. Use of alcohol, tobacco or illicit drugs  C. Current medications and supplements D. Functional ability and status E.  Nutritional status F.  Physical activity G. Advance directives H. List of  other physicians I.  Hospitalizations, surgeries, and ER visits in previous 12 months J.  [place] such as hearing and vision if needed, cognitive and depression L. Referrals and appointments   In addition, I have reviewed and discussed with patient certain preventive protocols, quality metrics, and best practice recommendations. A written personalized care plan for preventive services as well as general preventive health recommendations were provided to patient.   Signed,   Bevelyn Ngo, LPN  [id number] Nurse Health Advisor   Nurse Notes: none

## 2019-05-24 NOTE — Patient Instructions (Addendum)
Derek Mayo , Thank you for taking time to come for your Medicare Wellness Visit. I appreciate your ongoing commitment to your health goals. Please review the following plan we discussed and let me know if I can assist you in the future.   Screening recommendations/referrals: Colonoscopy: completed 2017 Recommended yearly ophthalmology/optometry visit for glaucoma screening and checkup Recommended yearly dental visit for hygiene and checkup  Vaccinations: Influenza vaccine: up to date  Pneumococcal vaccine: up to date  Tdap vaccine: up to date Shingles vaccine: shingrix eligible    Covid-19: first dose   Advanced directives: Advance directive discussed with you today. Please pick up a copy of this information next time you are in the office.  Once this is complete please bring a copy in to our office so we can scan it into your chart.  Conditions/risks identified: lung cancer screening discussed, will complete in June/July.   Next appointment: Follow up in one year for your annual wellness visit   Preventive Care 65 Years and Older, Male Preventive care refers to lifestyle choices and visits with your health care provider that can promote health and wellness. What does preventive care include?  A yearly physical exam. This is also called an annual well check.  Dental exams once or twice a year.  Routine eye exams. Ask your health care provider how often you should have your eyes checked.  Personal lifestyle choices, including:  Daily care of your teeth and gums.  Regular physical activity.  Eating a healthy diet.  Avoiding tobacco and drug use.  Limiting alcohol use.  Practicing safe sex.  Taking low doses of aspirin every day.  Taking vitamin and mineral supplements as recommended by your health care provider. What happens during an annual well check? The services and screenings done by your health care provider during your annual well check will depend on your age,  overall health, lifestyle risk factors, and family history of disease. Counseling  Your health care provider may ask you questions about your:  Alcohol use.  Tobacco use.  Drug use.  Emotional well-being.  Home and relationship well-being.  Sexual activity.  Eating habits.  History of falls.  Memory and ability to understand (cognition).  Work and work Statistician. Screening  You may have the following tests or measurements:  Height, weight, and BMI.  Blood pressure.  Lipid and cholesterol levels. These may be checked every 5 years, or more frequently if you are over 58 years old.  Skin check.  Lung cancer screening. You may have this screening every year starting at age 85 if you have a 30-pack-year history of smoking and currently smoke or have quit within the past 15 years.  Fecal occult blood test (FOBT) of the stool. You may have this test every year starting at age 40.  Flexible sigmoidoscopy or colonoscopy. You may have a sigmoidoscopy every 5 years or a colonoscopy every 10 years starting at age 27.  Prostate cancer screening. Recommendations will vary depending on your family history and other risks.  Hepatitis C blood test.  Hepatitis B blood test.  Sexually transmitted disease (STD) testing.  Diabetes screening. This is done by checking your blood sugar (glucose) after you have not eaten for a while (fasting). You may have this done every 1-3 years.  Abdominal aortic aneurysm (AAA) screening. You may need this if you are a current or former smoker.  Osteoporosis. You may be screened starting at age 33 if you are at high risk. Talk with  your health care provider about your test results, treatment options, and if necessary, the need for more tests. Vaccines  Your health care provider may recommend certain vaccines, such as:  Influenza vaccine. This is recommended every year.  Tetanus, diphtheria, and acellular pertussis (Tdap, Td) vaccine. You may  need a Td booster every 10 years.  Zoster vaccine. You may need this after age 54.  Pneumococcal 13-valent conjugate (PCV13) vaccine. One dose is recommended after age 38.  Pneumococcal polysaccharide (PPSV23) vaccine. One dose is recommended after age 30. Talk to your health care provider about which screenings and vaccines you need and how often you need them. This information is not intended to replace advice given to you by your health care provider. Make sure you discuss any questions you have with your health care provider. Document Released: 03/09/2015 Document Revised: 10/31/2015 Document Reviewed: 12/12/2014 Elsevier Interactive Patient Education  2017 [place] Prevention in the Home Falls can cause injuries. They can happen to people of all ages. There are many things you can do to make your home safe and to help prevent falls. What can I do on the outside of my home?  Regularly fix the edges of walkways and driveways and fix any cracks.  Remove anything that might make you trip as you walk through a door, such as a raised step or threshold.  Trim any bushes or trees on the path to your home.  Use bright outdoor lighting.  Clear any walking paths of anything that might make someone trip, such as rocks or tools.  Regularly check to see if handrails are loose or broken. Make sure that both sides of any steps have handrails.  Any raised decks and porches should have guardrails on the edges.  Have any leaves, snow, or ice cleared regularly.  Use sand or salt on walking paths during winter.  Clean up any spills in your garage right away. This includes oil or grease spills. What can I do in the bathroom?  Use night lights.  Install grab bars by the toilet and in the tub and shower. Do not use towel bars as grab bars.  Use non-skid mats or decals in the tub or shower.  If you need to sit down in the shower, use a plastic, non-slip stool.  Keep the floor  dry. Clean up any water that spills on the floor as soon as it happens.  Remove soap buildup in the tub or shower regularly.  Attach bath mats securely with double-sided non-slip rug tape.  Do not have throw rugs and other things on the floor that can make you trip. What can I do in the bedroom?  Use night lights.  Make sure that you have a light by your bed that is easy to reach.  Do not use any sheets or blankets that are too big for your bed. They should not hang down onto the floor.  Have a firm chair that has side arms. You can use this for support while you get dressed.  Do not have throw rugs and other things on the floor that can make you trip. What can I do in the kitchen?  Clean up any spills right away.  Avoid walking on wet floors.  Keep items that you use a lot in easy-to-reach places.  If you need to reach something above you, use a strong step stool that has a grab bar.  Keep electrical cords out of the way.  Do not  use floor polish or wax that makes floors slippery. If you must use wax, use non-skid floor wax.  Do not have throw rugs and other things on the floor that can make you trip. What can I do with my stairs?  Do not leave any items on the stairs.  Make sure that there are handrails on both sides of the stairs and use them. Fix handrails that are broken or loose. Make sure that handrails are as long as the stairways.  Check any carpeting to make sure that it is firmly attached to the stairs. Fix any carpet that is loose or worn.  Avoid having throw rugs at the top or bottom of the stairs. If you do have throw rugs, attach them to the floor with carpet tape.  Make sure that you have a light switch at the top of the stairs and the bottom of the stairs. If you do not have them, ask someone to add them for you. What else can I do to help prevent falls?  Wear shoes that:  Do not have high heels.  Have rubber bottoms.  Are comfortable and fit you  well.  Are closed at the toe. Do not wear sandals.  If you use a stepladder:  Make sure that it is fully opened. Do not climb a closed stepladder.  Make sure that both sides of the stepladder are locked into place.  Ask someone to hold it for you, if possible.  Clearly mark and make sure that you can see:  Any grab bars or handrails.  First and last steps.  Where the edge of each step is.  Use tools that help you move around (mobility aids) if they are needed. These include:  Canes.  Walkers.  Scooters.  Crutches.  Turn on the lights when you go into a dark area. Replace any light bulbs as soon as they burn out.  Set up your furniture so you have a clear path. Avoid moving your furniture around.  If any of your floors are uneven, fix them.  If there are any pets around you, be aware of where they are.  Review your medicines with your doctor. Some medicines can make you feel dizzy. This can increase your chance of falling. Ask your doctor what other things that you can do to help prevent falls. This information is not intended to replace advice given to you by your health care provider. Make sure you discuss any questions you have with your health care provider. Document Released: 12/07/2008 Document Revised: 07/19/2015 Document Reviewed: 03/17/2014 Elsevier Interactive Patient Education  2017 [employer].

## 2019-05-25 ENCOUNTER — Encounter: Payer: Medicare HMO | Admitting: Family Medicine

## 2019-06-23 ENCOUNTER — Telehealth: Payer: Self-pay

## 2019-06-23 DIAGNOSIS — Z87891 Personal history of nicotine dependence: Secondary | ICD-10-CM

## 2019-06-23 DIAGNOSIS — Z122 Encounter for screening for malignant neoplasm of respiratory organs: Secondary | ICD-10-CM

## 2019-06-23 NOTE — Telephone Encounter (Signed)
Patient has been notified that the low dose lung cancer screening CT scan is due currently or will be in near future.  Confirmed that patient is within the appropriate age range and asymptomatic, (no signs or symptoms of lung cancer).  Patient denies illness that would prevent curative treatment for lung cancer if found.  Patient is agreeable for CT scan being scheduled.  Verified smoking history (former smoker, quit 2014 with 40 year 1 ppd history).   Received 2nd COVID vaccine on 06/04/19  Same ins with a new policy number [id number]  CT scan scheduled for 06/28/2019 @ 9:30.

## 2019-06-24 NOTE — Telephone Encounter (Signed)
Smoking history: former, quit 08/13/12, 40 pack year

## 2019-06-24 NOTE — Addendum Note (Signed)
Addended by: Lieutenant Diego on: 06/24/2019 03:25 PM   Modules accepted: Orders

## 2019-06-27 ENCOUNTER — Telehealth: Payer: Self-pay

## 2019-06-27 NOTE — Telephone Encounter (Signed)
Patient informed of low dose lung cancer screening CT scan appointment on 06/30/19 @ 8:45.

## 2019-06-30 ENCOUNTER — Other Ambulatory Visit: Payer: Self-pay | Admitting: Family Medicine

## 2019-06-30 ENCOUNTER — Ambulatory Visit
Admission: RE | Admit: 2019-06-30 | Discharge: 2019-06-30 | Disposition: A | Discharge status: Home / Self Care | Payer: Medicare HMO | Source: Ambulatory Visit | Attending: Oncology | Admitting: Oncology

## 2019-06-30 ENCOUNTER — Other Ambulatory Visit: Payer: Self-pay

## 2019-06-30 DIAGNOSIS — Z87891 Personal history of nicotine dependence: Secondary | ICD-10-CM | POA: Insufficient documentation

## 2019-06-30 DIAGNOSIS — Z122 Encounter for screening for malignant neoplasm of respiratory organs: Secondary | ICD-10-CM | POA: Diagnosis not present

## 2019-06-30 DIAGNOSIS — E785 Hyperlipidemia, unspecified: Secondary | ICD-10-CM

## 2019-07-04 ENCOUNTER — Encounter: Payer: Self-pay | Admitting: *Deleted

## 2019-07-28 ENCOUNTER — Other Ambulatory Visit: Payer: Self-pay

## 2019-07-28 ENCOUNTER — Other Ambulatory Visit: Payer: Medicare HMO

## 2019-07-28 DIAGNOSIS — R7309 Other abnormal glucose: Secondary | ICD-10-CM | POA: Diagnosis not present

## 2019-07-28 DIAGNOSIS — Z0000 Encounter for general adult medical examination without abnormal findings: Secondary | ICD-10-CM | POA: Diagnosis not present

## 2019-07-28 DIAGNOSIS — E785 Hyperlipidemia, unspecified: Secondary | ICD-10-CM | POA: Diagnosis not present

## 2019-07-28 DIAGNOSIS — R3912 Poor urinary stream: Secondary | ICD-10-CM | POA: Diagnosis not present

## 2019-07-28 DIAGNOSIS — J432 Centrilobular emphysema: Secondary | ICD-10-CM | POA: Diagnosis not present

## 2019-07-28 DIAGNOSIS — N401 Enlarged prostate with lower urinary tract symptoms: Secondary | ICD-10-CM | POA: Diagnosis not present

## 2019-07-28 DIAGNOSIS — R972 Elevated prostate specific antigen [PSA]: Secondary | ICD-10-CM | POA: Diagnosis not present

## 2019-07-29 LAB — CBC WITH DIFFERENTIAL/PLATELET
Absolute Monocytes: 578 cells/uL (ref 200–950)
Basophils Absolute: 83 cells/uL (ref 0–200)
Basophils Relative: 1.4 %
Eosinophils Absolute: 142 cells/uL (ref 15–500)
Eosinophils Relative: 2.4 %
HCT: 48.6 % (ref 38.5–50.0)
Hemoglobin: 16.2 g/dL (ref 13.2–17.1)
Lymphs Abs: 1156 cells/uL (ref 850–3900)
MCH: 30.3 pg (ref 27.0–33.0)
MCHC: 33.3 g/dL (ref 32.0–36.0)
MCV: 91 fL (ref 80.0–100.0)
MPV: 12.7 fL — ABNORMAL HIGH (ref 7.5–12.5)
Monocytes Relative: 9.8 %
Neutro Abs: 3941 cells/uL (ref 1500–7800)
Neutrophils Relative %: 66.8 %
Platelets: 193 10*3/uL (ref 140–400)
RBC: 5.34 10*6/uL (ref 4.20–5.80)
RDW: 12.5 % (ref 11.0–15.0)
Total Lymphocyte: 19.6 %
WBC: 5.9 10*3/uL (ref 3.8–10.8)

## 2019-07-29 LAB — LIPID PANEL
Cholesterol: 161 mg/dL (ref <200)
HDL: 51 mg/dL (ref >=40)
LDL Cholesterol (Calc): 91 mg/dL (calc)
Non-HDL Cholesterol (Calc): 110 mg/dL (calc) (ref <130)
Total CHOL/HDL Ratio: 3.2 (calc) (ref <5.0)
Triglycerides: 101 mg/dL (ref <150)

## 2019-07-29 LAB — COMPLETE METABOLIC PANEL WITH GFR
AG Ratio: 2.4 (calc) (ref 1.0–2.5)
ALT: 11 U/L (ref 9–46)
AST: 18 U/L (ref 10–35)
Albumin: 4.4 g/dL (ref 3.6–5.1)
Alkaline phosphatase (APISO): 59 U/L (ref 35–144)
BUN: 15 mg/dL (ref 7–25)
CO2: 28 mmol/L (ref 20–32)
Calcium: 9.6 mg/dL (ref 8.6–10.3)
Chloride: 103 mmol/L (ref 98–110)
Creat: 1.14 mg/dL (ref 0.70–1.18)
GFR, Est African American: 75 mL/min/{1.73_m2} (ref >=60)
GFR, Est Non African American: 64 mL/min/{1.73_m2} (ref >=60)
Globulin: 1.8 g/dL (calc) — ABNORMAL LOW (ref 1.9–3.7)
Glucose, Bld: 90 mg/dL (ref 65–99)
Potassium: 4.3 mmol/L (ref 3.5–5.3)
Sodium: 139 mmol/L (ref 135–146)
Total Bilirubin: 0.9 mg/dL (ref 0.2–1.2)
Total Protein: 6.2 g/dL (ref 6.1–8.1)

## 2019-07-29 LAB — TSH: TSH: 1.7 mIU/L (ref 0.40–4.50)

## 2019-07-29 LAB — HEMOGLOBIN A1C
Hgb A1c MFr Bld: 5.1 % of total Hgb (ref <5.7)
Mean Plasma Glucose: 100 (calc)
eAG (mmol/L): 5.5 (calc)

## 2019-07-29 LAB — PSA: PSA: 4.1 ng/mL — ABNORMAL HIGH (ref <=4.0)

## 2019-08-04 ENCOUNTER — Other Ambulatory Visit: Payer: Self-pay

## 2019-08-04 ENCOUNTER — Encounter: Payer: Self-pay | Admitting: Family Medicine

## 2019-08-04 ENCOUNTER — Ambulatory Visit (INDEPENDENT_AMBULATORY_CARE_PROVIDER_SITE_OTHER): Payer: Medicare HMO | Admitting: Family Medicine

## 2019-08-04 VITALS — BP 121/84 | HR 93 | Temp 97.3°F | Resp 16 | Ht 67.0 in | Wt 148.6 lb

## 2019-08-04 DIAGNOSIS — E785 Hyperlipidemia, unspecified: Secondary | ICD-10-CM

## 2019-08-04 DIAGNOSIS — I700 Atherosclerosis of aorta: Secondary | ICD-10-CM | POA: Diagnosis not present

## 2019-08-04 DIAGNOSIS — Z0000 Encounter for general adult medical examination without abnormal findings: Secondary | ICD-10-CM

## 2019-08-04 DIAGNOSIS — J432 Centrilobular emphysema: Secondary | ICD-10-CM | POA: Diagnosis not present

## 2019-08-04 DIAGNOSIS — R972 Elevated prostate specific antigen [PSA]: Secondary | ICD-10-CM | POA: Diagnosis not present

## 2019-08-04 DIAGNOSIS — M06041 Rheumatoid arthritis without rheumatoid factor, right hand: Secondary | ICD-10-CM | POA: Diagnosis not present

## 2019-08-04 DIAGNOSIS — J3089 Other allergic rhinitis: Secondary | ICD-10-CM | POA: Diagnosis not present

## 2019-08-04 DIAGNOSIS — M06042 Rheumatoid arthritis without rheumatoid factor, left hand: Secondary | ICD-10-CM

## 2019-08-04 MED ORDER — ROSUVASTATIN CALCIUM 10 MG PO TABS: 10.0000 mg | ORAL_TABLET | ORAL | 3 refills | Status: DC

## 2019-08-04 NOTE — Assessment & Plan Note (Signed)
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 Continue Rosuvastatin 10mg  TWICE weekly - highest tolerated dose, failed 3 times weekly 2. Continue ASA 81mg  for primary ASCVD risk reduction 3. Encourage improved lifestyle - low carb/cholesterol, reduce portion size, continue improving regular exercise 4. Follow-up yearly lipids

## 2019-08-04 NOTE — Progress Notes (Signed)
Subjective:    Patient ID: Derek Mayo, male    DOB: 19-Nov-1945, 74 y.o.   MRN: [id number]  Derek Mayo is a 74 y.o. male presenting on 08/04/2019 for Annual Exam   HPI  Here for Annual Physical and Lab Review:  Centrilobular Emphysema COPD SevereStage IV // Allergic Rhinitis Followed by [facility] Pulmonology Dr Raul Del, continued on current regimen with Incruse, Albuterol, Flovent, and Flonase, Nasal Saline. - Today he reports doing well. No recent flare. overall improved with breathing with weight loss, pulse ox 96% - He uses Azelastine usually at night PRN. He uses ventolin PRN. He will use Flovent and Flonase daily. He also has Atrovent decongestant spray PRN. He will consider Pseudophed PRN only if need decongestant - He will follow-up with Dr Raul Del q 6 months  Osteoarthritis, multiple joints / Back Pain Rheumatoid Arthritis Hands See prior notes for background, he has multiple joints with OA/DJD and arthritic and muscle pain - Today he reports no new acute complaints, history of hand pain and back pain   PROSTATE SCREENING / Elevated PSA / Mild BPH Last PSAat 4.1 (prior trend 3.5 to 4.7) Currentlywith mildBPH LUTS, see AUA score.Now reports KNOWN family history of prostate CA, younger brother dx with mild prostate cancer s/p treated. - He admits to some nocturia occasionally only 1x nightly usually after 6 hours, only worse if drink coffee/beer, and daytime frequency if drink either of these, otherwise no problem. - No formal dx of BPH in past - Never on medications  HYPERLIPIDEMIA / BMI >23 Weight down to 144 lbs (naked weight) better breathing overall Improved cholesterol Total >200-240, down to 160s, LDL 117-158, now down to 91 previously off Atorvastatin due to myalgia Tolerating Rosuvastatin 10mg  TWICE weekly now  Elevated Calcium- resolved  UNCHANGED AUA BPH Symptom Score over past 1 month 1. Sensation of not emptying bladder post void -2 2.  Urinate less than 2 hour after finish last void -0 3. Start/Stop several times during void -0 4. Difficult to postpone urination -0 5. Weak urinary stream -3 6. Push or strain urination -0 7. Nocturia -1times  Total Score:6(MildBPH symptoms)  Health Maintenance: Lung CA Screening: Last LDCT 06/2019 - negative, advised repeat in 12 months. He is asymptomatic. Age 53, long history of smoking >40 pack years, ready for repeat scan.  UTD Pneumonia vaccines, completed entire series UTD AAA Screening ultrasound, negative 08/2016  Additional history that his wife Derek Mayo, stage 1, breast cancer, will do lumpectomy, and lymph biopsy, will do MRI.  Depression screen [facility] 2/9 08/04/2019 05/24/2019 11/17/2018  Decreased Interest 0 0 0  Down, Depressed, Hopeless 0 0 0  PHQ - 2 Score 0 0 0  Altered sleeping - - 0  Tired, decreased energy - - 0  Change in appetite - - 0  Feeling bad or failure about yourself  - - 0  Trouble concentrating - - 0  Moving slowly or fidgety/restless - - 0  Suicidal thoughts - - 0  PHQ-9 Score - - 0  Difficult doing work/chores - - -    Past Medical History:  Diagnosis Date  . Allergic rhinitis   . Anemia   . Chicken pox   . COPD (chronic obstructive pulmonary disease) ([place])   . Depression   . Measles   . Mumps   . Premature beats    Past Surgical History:  Procedure Laterality Date  . CHEST SURGERY    . COLONOSCOPY WITH PROPOFOL N/A 06/25/2015   Procedure: COLONOSCOPY WITH  PROPOFOL;  Surgeon: Josefine Class, MD;  Location: [facility] ENDOSCOPY;  Service: Endoscopy;  Laterality: N/A;  . KNEE SURGERY Left    removed broken cartilage  . TONSILECTOMY/ADENOIDECTOMY WITH MYRINGOTOMY    . TONSILLECTOMY     Social History   Socioeconomic History  . Marital status: Married    Spouse name: Not on file  . Number of children: Not on file  . Years of education: Not on file  . Highest education level: Not on file  Occupational History  . Not on file    Tobacco Use  . Smoking status: Former Smoker    Packs/day: 1.00    Years: 40.00    Pack years: 40.00    Types: Cigars, Cigarettes    Quit date: 08/13/2012    Years since quitting: 6.9  . Smokeless tobacco: Never Used  Vaping Use  . Vaping Use: Never used  Substance and Sexual Activity  . Alcohol use: Yes    Alcohol/week: 7.0 standard drinks    Types: 7 Cans of beer per week  . Drug use: Not Currently  . Sexual activity: Not on file  Other Topics Concern  . Not on file  Social History Narrative  . Not on file   Social Determinants of Health   Financial Resource Strain:   . Difficulty of Paying Living Expenses:   Food Insecurity:   . Worried About Charity fundraiser in the Last Year:   . Arboriculturist in the Last Year:   Transportation Needs:   . Film/video editor (Medical):   Marland Kitchen Lack of Transportation (Non-Medical):   Physical Activity:   . Days of Exercise per Week:   . Minutes of Exercise per Session:   Stress:   . Feeling of Stress :   Social Connections:   . Frequency of Communication with Friends and Family:   . Frequency of Social Gatherings with Friends and Family:   . Attends Religious Services:   . Active Member of Clubs or Organizations:   . Attends Archivist Meetings:   Marland Kitchen Marital Status:   Intimate Partner Violence:   . Fear of Current or Ex-Partner:   . Emotionally Abused:   Marland Kitchen Physically Abused:   . Sexually Abused:    Family History  Problem Relation Age of Onset  . Heart disease Father   . AAA (abdominal aortic aneurysm) Brother   . Prostate cancer Brother    Current Outpatient Medications on File Prior to Visit  Medication Sig  . albuterol (PROVENTIL) (2.5 MG/3ML) 0.083% nebulizer solution Inhale 0.083 mLs into the lungs every other day.  . albuterol (VENTOLIN HFA) 108 (90 BASE) MCG/ACT inhaler INHALE 2 PUFFS 4 TIMES A DAY AS NEEDED  . aspirin EC 81 MG tablet Take 81 mg by mouth daily.  . Azelastine HCl 0.15 % SOLN Place 1  spray into both nostrils daily as needed.  . fluticasone (FLONASE) 50 MCG/ACT nasal spray SPRAY 2 SPRAYS INTO EACH NOSTRIL EVERY DAY  . fluticasone (FLOVENT HFA) 110 MCG/ACT inhaler INHALE 1 PUFF TWICE A DAY  . ipratropium (ATROVENT) 0.03 % nasal spray Place 1 spray into both nostrils daily as needed.  . naproxen (NAPROSYN) 250 MG tablet Take 250 mg by mouth daily as needed.  . pseudoephedrine (SUDAFED) 30 MG tablet Take 30 mg by mouth every 4 (four) hours as needed for congestion.  Marland Kitchen umeclidinium bromide (INCRUSE ELLIPTA) 62.5 MCG/INH AEPB INHALE 1 INHALATION INTO THE LUNGS ONCE DAILY.  No current facility-administered medications on file prior to visit.    Review of Systems  Constitutional: Negative for activity change, appetite change, chills, diaphoresis, fatigue and fever.  HENT: Positive for postnasal drip. Negative for congestion and hearing loss.   Eyes: Negative for visual disturbance.  Respiratory: Positive for cough. Negative for apnea, choking, chest tightness, shortness of breath and wheezing.   Cardiovascular: Negative for chest pain, palpitations and leg swelling.  Gastrointestinal: Negative for abdominal pain, anal bleeding, blood in stool, constipation, diarrhea, nausea and vomiting.  Endocrine: Negative for cold intolerance.  Genitourinary: Negative for difficulty urinating, dysuria, frequency and hematuria.  Musculoskeletal: Negative for arthralgias, back pain and neck pain.  Skin: Negative for rash.  Allergic/Immunologic: Negative for environmental allergies.  Neurological: Negative for dizziness, weakness, light-headedness, numbness and headaches.  Hematological: Negative for adenopathy.  Psychiatric/Behavioral: Negative for behavioral problems, dysphoric mood and sleep disturbance. The patient is not nervous/anxious.    Per HPI unless specifically indicated above      Objective:    BP 121/84   Pulse 93   Temp (!) 97.3 F (36.3 C) (Temporal)   Resp 16   Ht  5\' 7"  (1.702 m)   Wt 148 lb 9.6 oz (67.4 kg)   SpO2 96%   BMI 23.27 kg/m   Wt Readings from Last 3 Encounters:  08/04/19 148 lb 9.6 oz (67.4 kg)  06/30/19 148 lb (67.1 kg)  11/17/18 154 lb (69.9 kg)    Physical Exam Vitals and nursing note reviewed.  Constitutional:      General: He is not in acute distress.    Appearance: He is well-developed. He is not diaphoretic.     Comments: Well-appearing elderly 74 yr old male, comfortable, cooperative  HENT:     Head: Normocephalic and atraumatic.  Eyes:     General:        Right eye: No discharge.        Left eye: No discharge.     Conjunctiva/sclera: Conjunctivae normal.     Pupils: Pupils are equal, round, and reactive to light.  Neck:     Thyroid: No thyromegaly.  Cardiovascular:     Rate and Rhythm: Normal rate and regular rhythm.     Heart sounds: Normal heart sounds. No murmur heard.   Pulmonary:     Effort: Pulmonary effort is normal. No respiratory distress.     Breath sounds: No wheezing or rales.     Comments: Mild upper airway transmitted air sounds, not actual wheezing. Good air movement overall with only mild reduction. No focal abnormality. Abdominal:     General: Bowel sounds are normal. There is no distension.     Palpations: Abdomen is soft. There is no mass.     Tenderness: There is no abdominal tenderness.  Musculoskeletal:        General: No tenderness. Normal range of motion.     Cervical back: Normal range of motion and neck supple.     Comments: Upper / Lower Extremities: - Normal muscle tone, strength bilateral upper extremities 5/5, lower extremities 5/5  Lymphadenopathy:     Cervical: No cervical adenopathy.  Skin:    General: Skin is warm and dry.     Findings: No erythema or rash.  Neurological:     Mental Status: He is alert and oriented to person, place, and time.     Comments: Distal sensation intact to light touch all extremities  Psychiatric:        Behavior: Behavior normal.  Comments: Well groomed, good eye contact, normal speech and thoughts      I have personally reviewed the radiology report from 06/2019 LDCT.  Narrative & Impression  CLINICAL DATA:  74 year old male former smoker (quit 7 years ago) with 40 pack-year history of smoking. Lung cancer screening examination.  EXAM: CT CHEST WITHOUT CONTRAST LOW-DOSE FOR LUNG CANCER SCREENING  TECHNIQUE: Multidetector CT imaging of the chest was performed following the standard protocol without IV contrast.  COMPARISON:  Low-dose lung cancer screening chest CT 03/22/2018.  FINDINGS: Cardiovascular: Heart size is normal. There is no significant pericardial fluid, thickening or pericardial calcification. There is aortic atherosclerosis, as well as atherosclerosis of the great vessels of the mediastinum and the coronary arteries, including calcified atherosclerotic plaque in the left main and left anterior descending coronary arteries. Mild calcifications of the aortic valve.  Mediastinum/Nodes: No pathologically enlarged mediastinal or hilar lymph nodes. Please note that accurate exclusion of hilar adenopathy is limited on noncontrast CT scans. Esophagus is unremarkable in appearance. No axillary lymphadenopathy.  Lungs/Pleura: Small pulmonary nodules are noted in the right upper lobe, largest of which is in the anterior aspect of the right upper lobe (axial image 103 of series 3), with a volume derived mean diameter of only 3.9 mm. No larger more suspicious appearing pulmonary nodules or masses are noted. No acute consolidative airspace disease. No pleural effusions. Diffuse bronchial wall thickening with moderate centrilobular and paraseptal emphysema.  Upper Abdomen: Aortic atherosclerosis. Exophytic incompletely imaged low-attenuation lesion in the interpolar region of the right kidney measuring at least 3.8 x 2.6 cm, not characterized on today's non-contrast CT examination, but  statistically likely to represent a cyst. A similar appearing lesion measuring 11 mm in the upper pole the left kidney is also noted, also likely a small cyst.  Musculoskeletal: There are no aggressive appearing lytic or blastic lesions noted in the visualized portions of the skeleton.  IMPRESSION: 1. Lung-RADS 2S, benign appearance or behavior. Continue annual screening with low-dose chest CT without contrast in 12 months. 2. The "S" modifier above refers to potentially clinically significant non lung cancer related findings. Specifically, there is aortic atherosclerosis, in addition to left main and left anterior descending coronary artery disease. Assessment for potential risk factor modification, dietary therapy or pharmacologic therapy may be warranted, if clinically indicated. 3. Mild diffuse bronchial wall thickening with mild centrilobular and paraseptal emphysema; imaging findings suggestive of underlying COPD. 4. There are calcifications of the aortic valve. Echocardiographic correlation for evaluation of potential valvular dysfunction may be warranted if clinically indicated.  Aortic Atherosclerosis (ICD10-I70.0) and Emphysema (ICD10-J43.9).   Electronically Signed   By: Vinnie Langton M.D.   On: 06/30/2019 14:08    Results for orders placed or performed in visit on 05/18/19  TSH  Result Value Ref Range   TSH 1.70 0.40 - 4.50 mIU/L  PSA  Result Value Ref Range   PSA 4.1 (H) < OR = 4.0 ng/mL  Lipid panel  Result Value Ref Range   Cholesterol 161 <200 mg/dL   HDL 51 > OR = 40 mg/dL   Triglycerides 101 <150 mg/dL   LDL Cholesterol (Calc) 91 mg/dL (calc)   Total CHOL/HDL Ratio 3.2 <5.0 (calc)   Non-HDL Cholesterol (Calc) 110 <130 mg/dL (calc)  COMPLETE METABOLIC PANEL WITH GFR  Result Value Ref Range   Glucose, Bld 90 65 - 99 mg/dL   BUN 15 7 - 25 mg/dL   Creat 1.14 0.70 - 1.18 mg/dL   GFR, Est  Non African American 64 > OR = 60 mL/min/1.14m2   GFR,  Est African American 75 > OR = 60 mL/min/1.34m2   BUN/Creatinine Ratio NOT APPLICABLE 6 - 22 (calc)   Sodium 139 135 - 146 mmol/L   Potassium 4.3 3.5 - 5.3 mmol/L   Chloride 103 98 - 110 mmol/L   CO2 28 20 - 32 mmol/L   Calcium 9.6 8.6 - 10.3 mg/dL   Total Protein 6.2 6.1 - 8.1 g/dL   Albumin 4.4 3.6 - 5.1 g/dL   Globulin 1.8 (L) 1.9 - 3.7 g/dL (calc)   AG Ratio 2.4 1.0 - 2.5 (calc)   Total Bilirubin 0.9 0.2 - 1.2 mg/dL   Alkaline phosphatase (APISO) 59 35 - 144 U/L   AST 18 10 - 35 U/L   ALT 11 9 - 46 U/L  CBC with Differential/Platelet  Result Value Ref Range   WBC 5.9 3.8 - 10.8 Thousand/uL   RBC 5.34 4.20 - 5.80 Million/uL   Hemoglobin 16.2 13.2 - 17.1 g/dL   HCT 48.6 38 - 50 %   MCV 91.0 80.0 - 100.0 fL   MCH 30.3 27.0 - 33.0 pg   MCHC 33.3 32.0 - 36.0 g/dL   RDW 12.5 11.0 - 15.0 %   Platelets 193 140 - 400 Thousand/uL   MPV 12.7 (H) 7.5 - 12.5 fL   Neutro Abs 3,941 1,500 - 7,800 cells/uL   Lymphs Abs 1,156 850 - 3,900 cells/uL   Absolute Monocytes 578 200 - 950 cells/uL   Eosinophils Absolute 142 15 - 500 cells/uL   Basophils Absolute 83 0 - 200 cells/uL   Neutrophils Relative % 66.8 %   Total Lymphocyte 19.6 %   Monocytes Relative 9.8 %   Eosinophils Relative 2.4 %   Basophils Relative 1.4 %  Hemoglobin A1c  Result Value Ref Range   Hgb A1c MFr Bld 5.1 <5.7 % of total Hgb   Mean Plasma Glucose 100 (calc)   eAG (mmol/L) 5.5 (calc)      Assessment & Plan:   Problem List Items Addressed This Visit    Rheumatoid arthritis involving both hands (HCC)    Stable with occasional episodic flares w/ inc repetitive activity  Continue infrequent NSAID for now - Aleve OTC intermittent only, he is aware of risks on these meds  For muscle stiffness/tightness related, can try rx Flexeril 5mg  QHS PRN      Elevated PSA, less than 10 ng/ml    Stable PSA 4.1 Fam history now reported, younger brother prostate CA mild BPH symptoms stable  Plan Counseling on PSA utility in  diagnosing prostate cancer / screening Reassurance WIll follow up repeat PSA in 6 to 12 months, likely defer next lab and can get yearly now      Dyslipidemia    Significantly improved lipid panel on intermittent statin twice weekly now Failed Atorvastatin 10mg  myalgias Calculated ASCVD 10 yr risk score 15-19%, former smoker  Plan: 1. Continue Rosuvastatin 10mg  TWICE weekly - highest tolerated dose, failed 3 times weekly 2. Continue ASA 81mg  for primary ASCVD risk reduction 3. Encourage improved lifestyle - low carb/cholesterol, reduce portion size, continue improving regular exercise 4. Follow-up yearly lipids      Relevant Medications   rosuvastatin (CRESTOR) 10 MG tablet   Centrilobular emphysema (HCC)    Stable without exacerbation Followed by [facility] Pulm Dr Donell Beers on [address] yearly last 06/2019 Former smoker, now second hand smoke  Chronic sinusitis / post  nasal drainage often triggers some upper airway symptoms for him  Plan: 1. Follow-up with Dr Raul Del as planned - Continues on Incruse Ellipta, Flovent, Albuterol - Continue sinus treatments - Flonase, Atrovent, Azelastine sprays      Aortic atherosclerosis ([place])    Stable without complication Identified on CT imaging Asymptomatic currently On intermittent statin therapy to control cholesterol reduce ASCVD risk      Relevant Medications   rosuvastatin (CRESTOR) 10 MG tablet   Allergic rhinitis, seasonal    Other Visit Diagnoses    Annual physical exam    -  Primary      Updated Health Maintenance information - UTD COVID19 vaccine Reviewed recent lab results with patient Encouraged improvement to lifestyle with diet and exercise Maintain weight.   Meds ordered this encounter  Medications  . rosuvastatin (CRESTOR) 10 MG tablet    Sig: Take 1 tablet (10 mg total) by mouth 2 (two) times a week.    Dispense:  24 tablet    Refill:  3    Add refills     Follow up plan: Return  in about 6 months (around 02/03/2020) for 6 month follow-up COPD, Kidney function can add lab.  Nobie Putnam, [facility] 08/04/2019, 9:19 AM

## 2019-08-04 NOTE — Assessment & Plan Note (Signed)
Stable without complication Identified on CT imaging Asymptomatic currently On intermittent statin therapy to control cholesterol reduce ASCVD risk

## 2019-08-04 NOTE — Assessment & Plan Note (Signed)
Stable with occasional episodic flares w/ inc repetitive activity  Continue infrequent NSAID for now - Aleve OTC intermittent only, he is aware of risks on these meds  For muscle stiffness/tightness related, can try rx Flexeril 5mg  QHS PRN

## 2019-08-04 NOTE — Assessment & Plan Note (Signed)
Stable PSA 4.1 Fam history now reported, younger brother prostate CA mild BPH symptoms stable  Plan Counseling on PSA utility in diagnosing prostate cancer / screening Reassurance WIll follow up repeat PSA in 6 to 12 months, likely defer next lab and can get yearly now

## 2019-08-04 NOTE — Patient Instructions (Addendum)
Thank you for coming to the office today.  Shingrix 2nd dose - anytime in July is fine. It is 2 to 6 months after first dose 05/26/19  Refilled Rosuvastatin with added refills. Keep on track. Very REMARKABLY good cholesterol!   Please schedule a Follow-up Appointment to: Return in about 6 months (around 02/03/2020) for 6 month follow-up COPD, Kidney function can add lab.  If you have any other questions or concerns, please feel free to call the office or send a message through [place]. You may also schedule an earlier appointment if necessary.  Additionally, you may be receiving a survey about your experience at our office within a few days to 1 week by e-mail or mail. We value your feedback.  Nobie Putnam, DO [place]

## 2019-08-04 NOTE — Assessment & Plan Note (Addendum)
Stable without exacerbation Followed by [facility] Dr Fleming Continues on Low Dose Lung CA Screen CT yearly last 06/2019 Former smoker, now second hand smoke  Chronic sinusitis / post nasal drainage often triggers some upper airway symptoms for him  Plan: 1. Follow-up with Dr Fleming as planned - Continues on Incruse Ellipta, Flovent, Albuterol - Continue sinus treatments - Flonase, Atrovent, Azelastine sprays 

## 2019-10-27 DIAGNOSIS — R0600 Dyspnea, unspecified: Secondary | ICD-10-CM | POA: Diagnosis not present

## 2019-10-27 DIAGNOSIS — J449 Chronic obstructive pulmonary disease, unspecified: Secondary | ICD-10-CM | POA: Diagnosis not present

## 2019-11-08 ENCOUNTER — Other Ambulatory Visit: Payer: Self-pay

## 2019-11-08 ENCOUNTER — Ambulatory Visit (INDEPENDENT_AMBULATORY_CARE_PROVIDER_SITE_OTHER): Payer: Medicare HMO

## 2019-11-08 ENCOUNTER — Ambulatory Visit: Payer: Medicare HMO

## 2019-11-08 DIAGNOSIS — Z23 Encounter for immunization: Secondary | ICD-10-CM

## 2019-12-21 ENCOUNTER — Other Ambulatory Visit: Payer: Self-pay | Admitting: Family Medicine

## 2019-12-21 DIAGNOSIS — J302 Other seasonal allergic rhinitis: Secondary | ICD-10-CM

## 2019-12-21 NOTE — Telephone Encounter (Signed)
Requested medication (s) are due for refill today: Yes  Requested medication (s) are on the active medication list: Yes  Last refill:  11/17/18  Future visit scheduled: Yes  Notes to clinic:  Prescription has expired.    Requested Prescriptions  Pending Prescriptions Disp Refills   ipratropium (ATROVENT) 0.03 % nasal spray [Pharmacy Med Name: IPRATROPIUM 0.03% SPRAY] 30 mL 1    Sig: USE 1 SPRAY INTO BOTH NOSTRILS DAILY AS NEEDED.      Off-Protocol Failed - 12/21/2019  4:45 AM      Failed - Medication not assigned to a protocol, review manually.      Passed - Valid encounter within last 12 months    Recent Outpatient Visits           4 months ago Annual physical exam   [place], DO   1 year ago Centrilobular emphysema [facility])   [place], DO   1 year ago Annual physical exam   [place], DO   2 years ago Dyslipidemia   [place], DO   2 years ago Annual physical exam   [facility] Olin Hauser, DO       Future Appointments             In 1 month [place], Devonne Doughty, [facility], [place]   In 5 months  [facility], [place]           Off-Protocol Failed - 12/21/2019  4:45 AM      Failed - Medication not assigned to a protocol, review manually.      Passed - Valid encounter within last 12 months    Recent Outpatient Visits           4 months ago Annual physical exam   [facility] Olin Hauser, DO   1 year ago Centrilobular emphysema [facility])   [facility] Parks Ranger, Devonne Doughty, DO   1 year ago Annual physical exam   [facility] Olin Hauser, DO   2 years ago Dyslipidemia   [place], DO   2 years ago Annual physical  exam   [facility] Olin Hauser, DO       Future Appointments             In 1 month [place], Devonne Doughty, [facility], [place]   In 5 months  [facility], [place]

## 2020-02-03 ENCOUNTER — Ambulatory Visit (INDEPENDENT_AMBULATORY_CARE_PROVIDER_SITE_OTHER): Payer: Medicare HMO | Admitting: Family Medicine

## 2020-02-03 ENCOUNTER — Encounter: Payer: Self-pay | Admitting: Family Medicine

## 2020-02-03 ENCOUNTER — Other Ambulatory Visit: Payer: Self-pay

## 2020-02-03 ENCOUNTER — Other Ambulatory Visit: Payer: Self-pay | Admitting: Family Medicine

## 2020-02-03 VITALS — BP 128/71 | HR 91 | Temp 97.1°F | Resp 16 | Ht 67.0 in | Wt 157.6 lb

## 2020-02-03 DIAGNOSIS — R972 Elevated prostate specific antigen [PSA]: Secondary | ICD-10-CM

## 2020-02-03 DIAGNOSIS — M06041 Rheumatoid arthritis without rheumatoid factor, right hand: Secondary | ICD-10-CM | POA: Diagnosis not present

## 2020-02-03 DIAGNOSIS — M06042 Rheumatoid arthritis without rheumatoid factor, left hand: Secondary | ICD-10-CM | POA: Diagnosis not present

## 2020-02-03 DIAGNOSIS — J432 Centrilobular emphysema: Secondary | ICD-10-CM | POA: Diagnosis not present

## 2020-02-03 DIAGNOSIS — E785 Hyperlipidemia, unspecified: Secondary | ICD-10-CM

## 2020-02-03 DIAGNOSIS — Z0000 Encounter for general adult medical examination without abnormal findings: Secondary | ICD-10-CM

## 2020-02-03 DIAGNOSIS — R7309 Other abnormal glucose: Secondary | ICD-10-CM

## 2020-02-03 DIAGNOSIS — I700 Atherosclerosis of aorta: Secondary | ICD-10-CM

## 2020-02-03 NOTE — Progress Notes (Signed)
Subjective:    Patient ID: Derek Mayo, male    DOB: 02-14-1946, 74 y.o.   MRN: [id number]  Shyhiem Beeney is a 74 y.o. male presenting on 02/03/2020 for COPD   HPI    Right Hand/arm worsening soreness with arthritis, he has been much more active lately. He has more soreness and stiffness in that R arm. He takes Naproxen PRN  He has improved his lung capacity playing Flute/Clarinet now.  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 - Today he reports doing well. No recent flare. overall improved with breathing with weight loss, pulse ox 96% -He uses Azelastine usually at night PRN. He uses ventolin PRN. He will use Flovent and Flonase daily. He also has Atrovent decongestant spray PRN. He will consider Pseudophed PRN only if need decongestant - He will follow-up with Dr Marcellina Millin 6 months  Osteoarthritis, multiple joints/ Back Pain Rheumatoid Arthritis Hands See prior notes for background, he has multiple joints with OA/DJD and arthritic and muscle pain - Today he reports no new acute complaints, history of hand pain and back pain   PROSTATE SCREENING / Elevated PSA / Mild BPH Last PSAat 4.1 (prior trend 3.5 to 4.7) Currentlywith mildBPH LUTS, see AUA score.Now reports KNOWNfamily history of prostate CA, younger brother dx with mild prostate cancer s/p treated. - He admits to some nocturia occasionally only 1x nightly usually after 6 hours, only worse if drink coffee/beer, and daytime frequency if drink either of these, otherwise no problem. - No formal dx of BPH in past - Never on medications  HYPERLIPIDEMIA Weight down to 142-144 lbs (naked weight) better breathing overall Our scales are elevated with his clothes on. Improved cholesterol Total cholesterol down to 160 on last lab. LDL 91 previously off  Atorvastatin due to myalgia Tolerating Rosuvastatin 10mg  TWICE weekly now   Elevated Calcium- resolved  Health Maintenance: Lung CA Screening: LastLDCT 06/2019-negative, advised repeat in 12 months. He is asymptomatic. Age 51, long history of smoking >40 pack years  UTD Pneumonia vaccines, completed entire series UTD AAA Screening ultrasound, negative 08/2016  Additional history  Wife had lumpectomy, 5 weeks radiation, no chemo. They did a lot of updates on their house renovations   Depression screen [facility] 2/9 02/03/2020 08/04/2019 05/24/2019  Decreased Interest 0 0 0  Down, Depressed, Hopeless 0 0 0  PHQ - 2 Score 0 0 0  Altered sleeping - - -  Tired, decreased energy - - -  Change in appetite - - -  Feeling bad or failure about yourself  - - -  Trouble concentrating - - -  Moving slowly or fidgety/restless - - -  Suicidal thoughts - - -  PHQ-9 Score - - -  Difficult doing work/chores - - -    Social History   Tobacco Use  . Smoking status: Former Smoker    Packs/day: 1.00    Years: 40.00    Pack years: 40.00    Types: Cigars, Cigarettes    Quit date: 08/13/2012    Years since quitting: 7.4  . Smokeless tobacco: Former Network engineer  . Vaping Use: Never used  Substance Use Topics  . Alcohol use: Yes    Alcohol/week: 7.0 standard drinks    Types: 7 Cans of beer per week  . Drug use: Not Currently    Review of Systems Per HPI unless specifically indicated above  Objective:    BP 128/71   Pulse 91   Temp (!) 97.1 F (36.2 C) (Temporal)   Resp 16   Ht 5\' 7"  (1.702 m)   Wt 157 lb 9.6 oz (71.5 kg)   SpO2 99%   BMI 24.68 kg/m   Wt Readings from Last 3 Encounters:  02/03/20 157 lb 9.6 oz (71.5 kg)  08/04/19 148 lb 9.6 oz (67.4 kg)  06/30/19 148 lb (67.1 kg)    Physical Exam    Results for orders placed or performed in visit on 05/18/19  TSH  Result Value Ref Range   TSH 1.70 0.40 - 4.50 mIU/L  PSA  Result Value Ref Range   PSA 4.1 (H) <  OR = 4.0 ng/mL  Lipid panel  Result Value Ref Range   Cholesterol 161 <200 mg/dL   HDL 51 > OR = 40 mg/dL   Triglycerides 101 <150 mg/dL   LDL Cholesterol (Calc) 91 mg/dL (calc)   Total CHOL/HDL Ratio 3.2 <5.0 (calc)   Non-HDL Cholesterol (Calc) 110 <130 mg/dL (calc)  COMPLETE METABOLIC PANEL WITH GFR  Result Value Ref Range   Glucose, Bld 90 65 - 99 mg/dL   BUN 15 7 - 25 mg/dL   Creat 1.14 0.70 - 1.18 mg/dL   GFR, Est Non African American 64 > OR = 60 mL/min/1.71m2   GFR, Est African American 75 > OR = 60 mL/min/1.79m2   BUN/Creatinine Ratio NOT APPLICABLE 6 - 22 (calc)   Sodium 139 135 - 146 mmol/L   Potassium 4.3 3.5 - 5.3 mmol/L   Chloride 103 98 - 110 mmol/L   CO2 28 20 - 32 mmol/L   Calcium 9.6 8.6 - 10.3 mg/dL   Total Protein 6.2 6.1 - 8.1 g/dL   Albumin 4.4 3.6 - 5.1 g/dL   Globulin 1.8 (L) 1.9 - 3.7 g/dL (calc)   AG Ratio 2.4 1.0 - 2.5 (calc)   Total Bilirubin 0.9 0.2 - 1.2 mg/dL   Alkaline phosphatase (APISO) 59 35 - 144 U/L   AST 18 10 - 35 U/L   ALT 11 9 - 46 U/L  CBC with Differential/Platelet  Result Value Ref Range   WBC 5.9 3.8 - 10.8 Thousand/uL   RBC 5.34 4.20 - 5.80 Million/uL   Hemoglobin 16.2 13.2 - 17.1 g/dL   HCT 48.6 38.5 - 50.0 %   MCV 91.0 80.0 - 100.0 fL   MCH 30.3 27.0 - 33.0 pg   MCHC 33.3 32.0 - 36.0 g/dL   RDW 12.5 11.0 - 15.0 %   Platelets 193 140 - 400 Thousand/uL   MPV 12.7 (H) 7.5 - 12.5 fL   Neutro Abs 3,941 1,500 - 7,800 cells/uL   Lymphs Abs 1,156 850 - 3,900 cells/uL   Absolute Monocytes 578 200 - 950 cells/uL   Eosinophils Absolute 142 15 - 500 cells/uL   Basophils Absolute 83 0 - 200 cells/uL   Neutrophils Relative % 66.8 %   Total Lymphocyte 19.6 %   Monocytes Relative 9.8 %   Eosinophils Relative 2.4 %   Basophils Relative 1.4 %  Hemoglobin A1c  Result Value Ref Range   Hgb A1c MFr Bld 5.1 <5.7 % of total Hgb   Mean Plasma Glucose 100 (calc)   eAG (mmol/L) 5.5 (calc)      Assessment & Plan:   Problem List Items  Addressed This Visit    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    Stable with occasional episodic flares  w/ inc repetitive activity  Continue infrequent NSAID for now - Aleve OTC intermittent only, he is aware of risks on these meds  Off muscle relaxants now  Temporary relief on Voltaren topical but not strong enough to provide much relief  Advise him to try R forearm lateral epicondylitis forearm strap OTC, and possibly WristWidget for TFCC wrist pain      Elevated PSA, less than 10 ng/ml    Last PSA mild elevated Repeat in 6 months with labs, offered today we agreed to recheck next visit      Centrilobular emphysema ([place])    Stable without exacerbation Followed by [facility] Pulm Dr Donell Beers on Low Dose Lung CA Screen CT yearly last 06/2019 Former smoker, now second hand smoke  Chronic sinusitis / post nasal drainage often triggers some upper airway symptoms for him  Plan: 1. Follow-up with Dr Raul Del as planned - Continues on Incruse Ellipta, Flovent, Albuterol - Continue sinus treatments - Flonase, Atrovent, Azelastine sprays         No orders of the defined types were placed in this encounter.    Follow up plan: Return in about 6 months (around 08/03/2020) for 6 month fasting lab only then 1 week later Annual Physical.  Future labs ordered for 07/2020 with all labs PSA  Nobie Putnam, [facility] 02/03/2020, 8:46 AM

## 2020-02-03 NOTE — Assessment & Plan Note (Signed)
Stable without exacerbation Followed by [facility] Pulm Dr Donell Beers on Low Dose Lung CA Screen CT yearly last 06/2019 Former smoker, now second hand smoke  Chronic sinusitis / post nasal drainage often triggers some upper airway symptoms for him  Plan: 1. Follow-up with Dr Raul Del as planned - Continues on Incruse Ellipta, Flovent, Albuterol - Continue sinus treatments - Flonase, Atrovent, Azelastine sprays

## 2020-02-03 NOTE — Assessment & Plan Note (Signed)
Stable with occasional episodic flares w/ inc repetitive activity  Continue infrequent NSAID for now - Aleve OTC intermittent only, he is aware of risks on these meds  Off muscle relaxants now  Temporary relief on Voltaren topical but not strong enough to provide much relief  Advise him to try R forearm lateral epicondylitis forearm strap OTC, and possibly WristWidget for TFCC wrist pain

## 2020-02-03 NOTE — Patient Instructions (Addendum)
Thank you for coming to the office today.  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  Recommend trial of Anti-inflammatory with Naproxen (Naprosyn) 500mg  tabs - take one with food and plenty of water TWICE daily every day (breakfast and dinner), for next 2 to 4 weeks, then you may take only as needed - DO NOT TAKE any ibuprofen, aleve, motrin while you are taking this medicine - It is safe to take Tylenol Ext Str 500mg  tabs - take 1 to 2 (max dose 1000mg ) every 6 hours as needed for breakthrough pain, max 24 hour daily dose is 6 to 8 tablets or 4000mg   Use RICE therapy: - R - Rest / relative rest with activity modification avoid overuse of joint - I - Ice packs (make sure you use a towel or sock / something to protect skin) - C - Compression with ACE wrap to apply pressure and reduce swelling allowing more support  Try a Forearm Tennis Elbow Strap over muscle as demonstrated - use with repetitive activity to reduce strain of muscle    - E - Elevation - if significant swelling, lift leg above heart level (toes above your nose) to help reduce swelling, most helpful at night after day of being on your feet  May consider Physical Therapy referral if interested    Please schedule a Follow-up Appointment to: Return in about 6 months (around 08/03/2020) for 6 month fasting lab only then 1 week later Annual Physical.  If you have any other questions  or concerns, please feel free to call the office or send a message through [facility]. You may also schedule an earlier appointment if necessary.  Additionally, you may be receiving a survey about your experience at our office within a few days to 1 week by e-mail or mail. We value your feedback.  Nobie Putnam, DO [place]

## 2020-02-03 NOTE — Assessment & Plan Note (Signed)
Last PSA mild elevated Repeat in 6 months with labs, offered today we agreed to recheck next visit

## 2020-02-04 ENCOUNTER — Other Ambulatory Visit: Payer: Self-pay | Admitting: Family Medicine

## 2020-02-04 DIAGNOSIS — J302 Other seasonal allergic rhinitis: Secondary | ICD-10-CM

## 2020-02-04 NOTE — Telephone Encounter (Signed)
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 rheumatoid factor [facility])   [facility], Devonne Doughty, DO   6 months ago Annual physical exam   [facility] Olin Hauser, DO   1 year ago Centrilobular emphysema [facility])   [facility] Olin Hauser, DO   1 year ago Annual physical exam   [facility] Olin Hauser, DO   2 years ago Dyslipidemia   [place], DO      Future Appointments            In 3 months  [facility], [place]   In 6 months [facility], Devonne Doughty, [facility], [facility]

## 2020-03-20 DIAGNOSIS — J449 Chronic obstructive pulmonary disease, unspecified: Secondary | ICD-10-CM | POA: Diagnosis not present

## 2020-03-20 DIAGNOSIS — R0600 Dyspnea, unspecified: Secondary | ICD-10-CM | POA: Diagnosis not present

## 2020-05-29 ENCOUNTER — Ambulatory Visit: Payer: Medicare HMO

## 2020-05-29 ENCOUNTER — Telehealth: Payer: Self-pay

## 2020-05-29 NOTE — Telephone Encounter (Signed)
This nurse attempted to call patient for telephonic AWV. Attempted a few times and he was unable to hear me and the line kept breaking up. Sounded like he said something about being in [place]. Will need to reschedule for another time.

## 2020-06-05 ENCOUNTER — Ambulatory Visit: Payer: Medicare HMO

## 2020-06-07 ENCOUNTER — Other Ambulatory Visit: Payer: Self-pay | Admitting: Family Medicine

## 2020-06-07 DIAGNOSIS — J302 Other seasonal allergic rhinitis: Secondary | ICD-10-CM

## 2020-06-07 NOTE — Telephone Encounter (Signed)
Requested Prescriptions  Pending Prescriptions Disp Refills  . ipratropium (ATROVENT) 0.03 % nasal spray [Pharmacy Med Name: IPRATROPIUM 0.03% SPRAY] 30 mL 1    Sig: USE 1 SPRAY INTO BOTH NOSTRILS DAILY AS NEEDED.     Off-Protocol Failed - 06/07/2020  1:17 AM      Failed - Medication not assigned to a protocol, review manually.      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 ([place])   [place], DO   10 months ago Annual physical exam   [place], DO   1 year ago Centrilobular emphysema [facility])   [place], DO   2 years ago Annual physical exam   [facility] Olin Hauser, DO   2 years ago Dyslipidemia   [place], DO      Future Appointments            In 2 months  [facility], [place]   In 3 months [facility], [place]   In 1 year  [facility], [facility]          Off-Protocol Failed - 06/07/2020  1:17 AM      Failed - Medication not assigned to a protocol, review manually.      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 ([place])   [facility], Devonne Doughty, DO   10 months ago Annual physical exam   [facility] Olin Hauser, DO   1 year ago Centrilobular emphysema [facility])   [facility] Olin Hauser, DO   2 years ago Annual physical exam   [facility] Olin Hauser, DO   2 years ago Dyslipidemia   [place], DO       Future Appointments            In 2 months  [facility], [place]   In 3 months [facility], Devonne Doughty, [facility], [place]   In 1 year  [facility], [place]

## 2020-06-13 ENCOUNTER — Telehealth: Payer: Self-pay | Admitting: *Deleted

## 2020-06-13 DIAGNOSIS — R509 Fever, unspecified: Secondary | ICD-10-CM | POA: Diagnosis not present

## 2020-06-13 DIAGNOSIS — Z03818 Encounter for observation for suspected exposure to other biological agents ruled out: Secondary | ICD-10-CM | POA: Diagnosis not present

## 2020-06-13 DIAGNOSIS — J441 Chronic obstructive pulmonary disease with (acute) exacerbation: Secondary | ICD-10-CM | POA: Diagnosis not present

## 2020-06-13 DIAGNOSIS — J209 Acute bronchitis, unspecified: Secondary | ICD-10-CM | POA: Diagnosis not present

## 2020-06-13 DIAGNOSIS — R059 Cough, unspecified: Secondary | ICD-10-CM | POA: Diagnosis not present

## 2020-06-13 DIAGNOSIS — B9689 Other specified bacterial agents as the cause of diseases classified elsewhere: Secondary | ICD-10-CM | POA: Diagnosis not present

## 2020-06-13 DIAGNOSIS — J019 Acute sinusitis, unspecified: Secondary | ICD-10-CM | POA: Diagnosis not present

## 2020-06-13 NOTE — Telephone Encounter (Signed)
Attempted to reach patient via telephone re: scheduling annual lung screening scan. Left voicemail message for patient to call me back. 

## 2020-06-19 ENCOUNTER — Telehealth: Payer: Self-pay | Admitting: *Deleted

## 2020-06-19 DIAGNOSIS — Z122 Encounter for screening for malignant neoplasm of respiratory organs: Secondary | ICD-10-CM

## 2020-06-19 DIAGNOSIS — Z87891 Personal history of nicotine dependence: Secondary | ICD-10-CM

## 2020-06-19 NOTE — Telephone Encounter (Signed)
Patient returned my call re: scheduling his annual lung screening scan. Former smoker, quit in 2014. Scan scheduled for 07/12/20 @ 8:45 am.

## 2020-06-19 NOTE — Telephone Encounter (Signed)
Attempted to reach patient via telephone re: scheduling annual lung screening scan. Left voicemail message for patient to call me back. 

## 2020-06-29 ENCOUNTER — Other Ambulatory Visit: Payer: Self-pay | Admitting: Family Medicine

## 2020-06-29 DIAGNOSIS — J302 Other seasonal allergic rhinitis: Secondary | ICD-10-CM

## 2020-06-29 NOTE — Telephone Encounter (Signed)
Requested medication (s) are due for refill today: yes  Requested medication (s) are on the active medication list: yes  Last refill:  06/07/20  Future visit scheduled: yes  Notes to clinic:  no assigned protocol    Requested Prescriptions  Pending Prescriptions Disp Refills   ipratropium (ATROVENT) 0.03 % nasal spray [Pharmacy Med Name: IPRATROPIUM 0.03% SPRAY]      Sig: USE 1 SPRAY INTO BOTH NOSTRILS DAILY AS NEEDED.      Off-Protocol Failed - 06/29/2020 10:35 AM      Failed - Medication not assigned to a protocol, review manually.      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 factor [facility])   [facility] Olin Hauser, DO   11 months ago Annual physical exam   [place], DO   1 year ago Centrilobular emphysema [facility])   [place], DO   2 years ago Annual physical exam   [place], DO   2 years ago Dyslipidemia   [place], DO       Future Appointments             In 1 month  [facility], [place]   In 2 months [facility], [place]   In 11 months  [facility], [place] Failed - 06/29/2020 10:35 AM      Failed - Medication not assigned to a protocol, review manually.      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 factor [facility])   [facility] Olin Hauser, DO   11 months ago Annual physical exam   [facility] Olin Hauser, DO   1 year ago Centrilobular emphysema [facility])   [facility] Olin Hauser, DO   2 years ago Annual physical exam   [facility] Olin Hauser, DO   2 years ago Dyslipidemia   [place], DO       Future Appointments             In 1 month  [facility], [place]   In 2 months [facility], [facility], [place]   In 11 months  [facility], [place]

## 2020-07-12 ENCOUNTER — Ambulatory Visit
Admission: RE | Admit: 2020-07-12 | Discharge: 2020-07-12 | Disposition: A | Discharge status: Home / Self Care | Payer: Medicare HMO | Source: Ambulatory Visit | Attending: Nurse Practitioner | Admitting: Nurse Practitioner

## 2020-07-12 ENCOUNTER — Other Ambulatory Visit: Payer: Self-pay

## 2020-07-12 DIAGNOSIS — Z122 Encounter for screening for malignant neoplasm of respiratory organs: Secondary | ICD-10-CM | POA: Diagnosis not present

## 2020-07-12 DIAGNOSIS — Z87891 Personal history of nicotine dependence: Secondary | ICD-10-CM | POA: Diagnosis not present

## 2020-07-25 ENCOUNTER — Encounter: Payer: Self-pay | Admitting: *Deleted

## 2020-07-30 ENCOUNTER — Other Ambulatory Visit: Payer: Medicare HMO

## 2020-08-01 ENCOUNTER — Other Ambulatory Visit: Payer: Self-pay

## 2020-08-01 NOTE — Progress Notes (Signed)
Pt presents today to complete post accident drug screen. Due to health issues and medications, urine might be short sent to the lab. CL,RMA

## 2020-08-05 ENCOUNTER — Other Ambulatory Visit: Payer: Self-pay | Admitting: Family Medicine

## 2020-08-05 DIAGNOSIS — J302 Other seasonal allergic rhinitis: Secondary | ICD-10-CM

## 2020-08-05 NOTE — Telephone Encounter (Signed)
Requested Prescriptions  Pending Prescriptions Disp Refills  . 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

## 2020-08-06 ENCOUNTER — Encounter: Payer: Medicare HMO | Admitting: Family Medicine

## 2020-08-07 ENCOUNTER — Ambulatory Visit (INDEPENDENT_AMBULATORY_CARE_PROVIDER_SITE_OTHER): Payer: Medicare HMO

## 2020-08-07 VITALS — Ht 67.0 in | Wt 135.0 lb

## 2020-08-07 DIAGNOSIS — Z0000 Encounter for general adult medical examination without abnormal findings: Secondary | ICD-10-CM

## 2020-08-07 NOTE — Patient Instructions (Signed)
Mr. Derek Mayo , Thank you for taking time to come for your Medicare Wellness Visit. I appreciate your ongoing commitment to your health goals. Please review the following plan we discussed and let me know if I can assist you in the future.   Screening recommendations/referrals: Colonoscopy: completed 06/25/2015 Recommended yearly ophthalmology/optometry visit for glaucoma screening and checkup Recommended yearly dental visit for hygiene and checkup  Vaccinations: Influenza vaccine: completed 11/08/2019, due 09/24/2020 Pneumococcal vaccine: completed 02/08/2014 Tdap vaccine: completed 10/08/2012, due 10/09/2022 Shingles vaccine: completed   Covid-19:  02/01/2020, 06/04/2019, 04/30/2019  Advanced directives: Advance directive discussed with you today.   Conditions/risks identified: none  Next appointment: Follow up in one year for your annual wellness visit.   Preventive Care 19 Years and Older, Male Preventive care refers to lifestyle choices and visits with your health care provider that can promote health and wellness. What does preventive care include? A yearly physical exam. This is also called an annual well check. Dental exams once or twice a year. Routine eye exams. Ask your health care provider how often you should have your eyes checked. Personal lifestyle choices, including: Daily care of your teeth and gums. Regular physical activity. Eating a healthy diet. Avoiding tobacco and drug use. Limiting alcohol use. Practicing safe sex. Taking low doses of aspirin every day. Taking vitamin and mineral supplements as recommended by your health care provider. What happens during an annual well check? The services and screenings done by your health care provider during your annual well check will depend on your age, overall health, lifestyle risk factors, and family history of disease. Counseling  Your health care provider may ask you questions about your: Alcohol use. Tobacco use. Drug  use. Emotional well-being. Home and relationship well-being. Sexual activity. Eating habits. History of falls. Memory and ability to understand (cognition). Work and work Statistician. Screening  You may have the following tests or measurements: Height, weight, and BMI. Blood pressure. Lipid and cholesterol levels. These may be checked every 5 years, or more frequently if you are over 37 years old. Skin check. Lung cancer screening. You may have this screening every year starting at age 22 if you have a 30-pack-year history of smoking and currently smoke or have quit within the past 15 years. Fecal occult blood test (FOBT) of the stool. You may have this test every year starting at age 6. Flexible sigmoidoscopy or colonoscopy. You may have a sigmoidoscopy every 5 years or a colonoscopy every 10 years starting at age 69. Prostate cancer screening. Recommendations will vary depending on your family history and other risks. Hepatitis C blood test. Hepatitis B blood test. Sexually transmitted disease (STD) testing. Diabetes screening. This is done by checking your blood sugar (glucose) after you have not eaten for a while (fasting). You may have this done every 1-3 years. Abdominal aortic aneurysm (AAA) screening. You may need this if you are a current or former smoker. Osteoporosis. You may be screened starting at age 55 if you are at high risk. Talk with your health care provider about your test results, treatment options, and if necessary, the need for more tests. Vaccines  Your health care provider may recommend certain vaccines, such as: Influenza vaccine. This is recommended every year. Tetanus, diphtheria, and acellular pertussis (Tdap, Td) vaccine. You may need a Td booster every 10 years. Zoster vaccine. You may need this after age 72. Pneumococcal 13-valent conjugate (PCV13) vaccine. One dose is recommended after age 27. Pneumococcal polysaccharide (PPSV23) vaccine. One dose  is  recommended after age 63. Talk to your health care provider about which screenings and vaccines you need and how often you need them. This information is not intended to replace advice given to you by your health care provider. Make sure you discuss any questions you have with your health care provider. Document Released: 03/09/2015 Document Revised: 10/31/2015 Document Reviewed: 12/12/2014 Elsevier Interactive Patient Education  2017 [place] Prevention in the Home Falls can cause injuries. They can happen to people of all ages. There are many things you can do to make your home safe and to help prevent falls. What can I do on the outside of my home? Regularly fix the edges of walkways and driveways and fix any cracks. Remove anything that might make you trip as you walk through a door, such as a raised step or threshold. Trim any bushes or trees on the path to your home. Use bright outdoor lighting. Clear any walking paths of anything that might make someone trip, such as rocks or tools. Regularly check to see if handrails are loose or broken. Make sure that both sides of any steps have handrails. Any raised decks and porches should have guardrails on the edges. Have any leaves, snow, or ice cleared regularly. Use sand or salt on walking paths during winter. Clean up any spills in your garage right away. This includes oil or grease spills. What can I do in the bathroom? Use night lights. Install grab bars by the toilet and in the tub and shower. Do not use towel bars as grab bars. Use non-skid mats or decals in the tub or shower. If you need to sit down in the shower, use a plastic, non-slip stool. Keep the floor dry. Clean up any water that spills on the floor as soon as it happens. Remove soap buildup in the tub or shower regularly. Attach bath mats securely with double-sided non-slip rug tape. Do not have throw rugs and other things on the floor that can make you  trip. What can I do in the bedroom? Use night lights. Make sure that you have a light by your bed that is easy to reach. Do not use any sheets or blankets that are too big for your bed. They should not hang down onto the floor. Have a firm chair that has side arms. You can use this for support while you get dressed. Do not have throw rugs and other things on the floor that can make you trip. What can I do in the kitchen? Clean up any spills right away. Avoid walking on wet floors. Keep items that you use a lot in easy-to-reach places. If you need to reach something above you, use a strong step stool that has a grab bar. Keep electrical cords out of the way. Do not use floor polish or wax that makes floors slippery. If you must use wax, use non-skid floor wax. Do not have throw rugs and other things on the floor that can make you trip. What can I do with my stairs? Do not leave any items on the stairs. Make sure that there are handrails on both sides of the stairs and use them. Fix handrails that are broken or loose. Make sure that handrails are as long as the stairways. Check any carpeting to make sure that it is firmly attached to the stairs. Fix any carpet that is loose or worn. Avoid having throw rugs at the top or bottom of the stairs.  If you do have throw rugs, attach them to the floor with carpet tape. Make sure that you have a light switch at the top of the stairs and the bottom of the stairs. If you do not have them, ask someone to add them for you. What else can I do to help prevent falls? Wear shoes that: Do not have high heels. Have rubber bottoms. Are comfortable and fit you well. Are closed at the toe. Do not wear sandals. If you use a stepladder: Make sure that it is fully opened. Do not climb a closed stepladder. Make sure that both sides of the stepladder are locked into place. Ask someone to hold it for you, if possible. Clearly mark and make sure that you can  see: Any grab bars or handrails. First and last steps. Where the edge of each step is. Use tools that help you move around (mobility aids) if they are needed. These include: Canes. Walkers. Scooters. Crutches. Turn on the lights when you go into a dark area. Replace any light bulbs as soon as they burn out. Set up your furniture so you have a clear path. Avoid moving your furniture around. If any of your floors are uneven, fix them. If there are any pets around you, be aware of where they are. Review your medicines with your doctor. Some medicines can make you feel dizzy. This can increase your chance of falling. Ask your doctor what other things that you can do to help prevent falls. This information is not intended to replace advice given to you by your health care provider. Make sure you discuss any questions you have with your health care provider. Document Released: 12/07/2008 Document Revised: 07/19/2015 Document Reviewed: 03/17/2014 Elsevier Interactive Patient Education  2017 [employer].

## 2020-08-07 NOTE — Progress Notes (Signed)
I connected with Derek Mayo today by telephone and verified that I am speaking with the correct person using two identifiers. Location patient: home Location provider: work Persons participating in the virtual visit: Derek Mayo, Glenna Durand LPN.   I discussed the limitations, risks, security and privacy concerns of performing an evaluation and management service by telephone and the availability of in person appointments. I also discussed with the patient that there may be a patient responsible charge related to this service. The patient expressed understanding and verbally consented to this telephonic visit.    Interactive audio and video telecommunications were attempted between this provider and patient, however failed, due to patient having technical difficulties OR patient did not have access to video capability.  We continued and completed visit with audio only.     Vital signs may be patient reported or missing.  Subjective:   Derek Mayo is a 75 y.o. male who presents for Medicare Annual/Subsequent preventive examination.  Review of Systems     Cardiac Risk Factors include: advanced age (>9men, >85 women);male gender;sedentary lifestyle     Objective:    Today's Vitals   08/07/20 0936 08/07/20 0938  Weight: 135 lb (61.2 kg)   Height: 5\' 7"  (1.702 m)   PainSc:  5    Body mass index is 21.14 kg/m.  Advanced Directives 08/07/2020 05/24/2019 08/16/2015 06/25/2015  Does Patient Have a Medical Advance Directive? No No No No  Would patient like information on creating a medical advance directive? - - No - patient declined information -    Current Medications (verified) Outpatient Encounter Medications as of 08/07/2020  Medication Sig   albuterol (PROVENTIL) (2.5 MG/3ML) 0.083% nebulizer solution Inhale 0.083 mLs into the lungs every other day.   albuterol (VENTOLIN HFA) 108 (90 Base) MCG/ACT inhaler INHALE 2 PUFFS 4 TIMES A DAY AS NEEDED   aspirin EC 81 MG  tablet Take 81 mg by mouth daily.   Azelastine HCl 0.15 % SOLN Place 1 spray into both nostrils daily as needed.   fluticasone (FLONASE) 50 MCG/ACT nasal spray SPRAY 2 SPRAYS INTO EACH NOSTRIL EVERY DAY   fluticasone (FLOVENT HFA) 110 MCG/ACT inhaler INHALE 1 PUFF TWICE A DAY   ipratropium (ATROVENT) 0.03 % nasal spray USE 1 SPRAY INTO BOTH NOSTRILS DAILY AS NEEDED.   naproxen (NAPROSYN) 250 MG tablet Take 250 mg by mouth daily as needed.   pseudoephedrine (SUDAFED) 30 MG tablet Take 30 mg by mouth every 4 (four) hours as needed for congestion.   rosuvastatin (CRESTOR) 10 MG tablet Take 1 tablet (10 mg total) by mouth 2 (two) times a week.   SHINGRIX injection    umeclidinium bromide (INCRUSE ELLIPTA) 62.5 MCG/INH AEPB INHALE 1 INHALATION INTO THE LUNGS ONCE DAILY.   No facility-administered encounter medications on file as of 08/07/2020.    Allergies (verified) Patient has no known allergies.   History: Past Medical History:  Diagnosis Date   Allergic rhinitis    Anemia    Chicken pox    COPD (chronic obstructive pulmonary disease) (HCC)    Depression    Measles    Mumps    Premature beats    Past Surgical History:  Procedure Laterality Date   CHEST SURGERY     COLONOSCOPY WITH PROPOFOL N/A 06/25/2015   Procedure: COLONOSCOPY WITH PROPOFOL;  Surgeon: Josefine Class, MD;  Location: [facility] ENDOSCOPY;  Service: Endoscopy;  Laterality: N/A;   KNEE SURGERY Left    removed broken cartilage   TONSILECTOMY/ADENOIDECTOMY WITH  MYRINGOTOMY     TONSILLECTOMY     Family History  Problem Relation Age of Onset   Heart disease Father    AAA (abdominal aortic aneurysm) Brother    Prostate cancer Brother    Social History   Socioeconomic History   Marital status: Married    Spouse name: Not on file   Number of children: Not on file   Years of education: Not on file   Highest education level: Not on file  Occupational History   Not on file  Tobacco Use   Smoking status: Former     Packs/day: 1.00    Years: 40.00    Pack years: 40.00    Types: Cigars, Cigarettes    Quit date: 08/13/2012    Years since quitting: 7.9   Smokeless tobacco: Former  Scientific laboratory technician Use: Never used  Substance and Sexual Activity   Alcohol use: Not Currently    Alcohol/week: 7.0 standard drinks    Types: 7 Cans of beer per week   Drug use: Not Currently   Sexual activity: Not on file  Other Topics Concern   Not on file  Social History Narrative   Not on file   Social Determinants of Health   Financial Resource Strain: Low Risk    Difficulty of Paying Living Expenses: Not hard at all  Food Insecurity: No Food Insecurity   Worried About Charity fundraiser in the Last Year: Never true   [place] in the Last Year: Never true  Transportation Needs: No Transportation Needs   Lack of Transportation (Medical): No   Lack of Transportation (Non-Medical): No  Physical Activity: Inactive   Days of Exercise per Week: 0 days   Minutes of Exercise per Session: 0 min  Stress: Stress Concern Present   Feeling of Stress : To some extent  Social Connections: Not on file    Tobacco Counseling Counseling given: Not Answered   Clinical Intake:  Pre-visit preparation completed: Yes  Pain : 0-10 Pain Score: 5  Pain Type: Chronic pain Pain Location: Chest Pain Descriptors / Indicators: Aching Pain Onset: More than a month ago Pain Frequency: Constant     Nutritional Status: BMI of 19-24  Normal Nutritional Risks: None Diabetes: No  How often do you need to have someone help you when you read instructions, pamphlets, or other written materials from your doctor or pharmacy?: 1 - Never What is the last grade level you completed in school?: masters degree  Diabetic? no  Interpreter Needed?: No  Information entered by :: NAllen LPN   Activities of Daily Living In your present state of health, do you have any difficulty performing the following activities:  08/07/2020  Hearing? N  Vision? N  Difficulty concentrating or making decisions? Y  Comment short term memory  Walking or climbing stairs? Y  Comment due to breathing  Dressing or bathing? N  Doing errands, shopping? N  Preparing Food and eating ? N  Using the Toilet? N  In the past six months, have you accidently leaked urine? N  Do you have problems with loss of bowel control? N  Managing your Medications? N  Managing your Finances? N  Housekeeping or managing your Housekeeping? N  Some recent data might be hidden    Patient Care Team: Olin Hauser, DO as PCP - General (Family Medicine)  Indicate any recent Medical Services you may have received from other than [facility] providers in the past  year (date may be approximate).     Assessment:   This is a routine wellness examination for [facility].  Hearing/Vision screen Vision Screening - Comments:: No regular eye exams, [facility]  Dietary issues and exercise activities discussed: Current Exercise Habits: The patient does not participate in regular exercise at present   Goals Addressed             This Visit's Progress    Patient Stated       08/07/2020, wants to build up strength        Depression Screen PHQ 2/9 Scores 08/07/2020 02/03/2020 08/04/2019 05/24/2019 11/17/2018 02/25/2018 08/21/2017  PHQ - 2 Score 2 0 0 0 0 0 0  PHQ- 9 Score 10 - - - 0 - 0    Fall Risk Fall Risk  08/07/2020 02/03/2020 08/04/2019 05/24/2019 11/17/2018  Falls in the past year? 1 0 0 0 0  Comment lost balance when sick - - - -  Number falls in past yr: 0 0 0 0 0  Injury with Fall? 0 0 0 0 0  Risk for fall due to : Medication side effect - - - -  Follow up Falls evaluation completed;Education provided;Falls prevention discussed Falls evaluation completed Falls evaluation completed - -    FALL RISK PREVENTION PERTAINING TO THE HOME:  Any stairs in or around the home? No  If so, are there any without handrails? N/a Home free  of loose throw rugs in walkways, pet beds, electrical cords, etc? Yes  Adequate lighting in your home to reduce risk of falls? Yes   ASSISTIVE DEVICES UTILIZED TO PREVENT FALLS:  Life alert? No  Use of a cane, walker or w/c? No  Grab bars in the bathroom? Yes  Shower chair or bench in shower? Yes  Elevated toilet seat or a handicapped toilet? Yes   TIMED UP AND GO:  Was the test performed? No .      Cognitive Function:     6CIT Screen 08/07/2020  What Year? 0 points  What month? 0 points  What time? 0 points  Count back from 20 0 points  Months in reverse 0 points  Repeat phrase 2 points  Total Score 2    Immunizations Immunization History  Administered Date(s) Administered   Fluad Quad(high Dose 65+) 11/17/2018, 11/08/2019   Influenza, High Dose Seasonal PF 11/13/2014, 11/12/2015, 11/03/2016, 11/27/2017   Influenza-Unspecified 11/28/2013   Moderna Sars-Covid-2 Vaccination 04/30/2019, 06/04/2019, 02/01/2020   Pneumococcal Conjugate-13 02/08/2014   Pneumococcal Polysaccharide-23 10/08/2012   Tdap 10/08/2012   Zoster Recombinat (Shingrix) 05/26/2019, 10/04/2019   Zoster, Live 01/04/2013    TDAP status: Up to date  Flu Vaccine status: Up to date  Pneumococcal vaccine status: Up to date  Covid-19 vaccine status: Completed vaccines  Qualifies for Shingles Vaccine? Yes   Zostavax completed Yes   Shingrix Completed?: Yes  Screening Tests Health Maintenance  Topic Date Due   COVID-19 Vaccine (4 - Booster for Moderna series) 06/01/2020   INFLUENZA VACCINE  09/24/2020   TETANUS/TDAP  10/09/2022   COLONOSCOPY (Pts 45-57yrs Insurance coverage will need to be confirmed)  06/24/2025   Hepatitis C Screening  Completed   PNA vac Low Risk Adult  Completed   Zoster Vaccines- Shingrix  Completed   HPV VACCINES  Aged Out    Health Maintenance  Health Maintenance Due  Topic Date Due   COVID-19 Vaccine (4 - Booster for Moderna series) 06/01/2020    Colorectal  cancer screening: Type of  screening: Colonoscopy. Completed 06/25/2015. Repeat every 10 years  Lung Cancer Screening: (Low Dose CT Chest recommended if Age 25-80 years, 30 pack-year currently smoking OR have quit w/in 15years.) does qualify.   Lung Cancer Screening Referral: CT scan 07/18/2020  Additional Screening:  Hepatitis C Screening: does qualify; Completed 03/21/2016  Vision Screening: Recommended annual ophthalmology exams for early detection of glaucoma and other disorders of the eye. Is the patient up to date with their annual eye exam?  No  Who is the provider or what is the name of the office in which the patient attends annual eye exams? [facility] If pt is not established with a provider, would they like to be referred to a provider to establish care? No .   Dental Screening: Recommended annual dental exams for proper oral hygiene  Community Resource Referral / Chronic Care Management: CRR required this visit?  No   CCM required this visit?  No      Plan:     I have personally reviewed and noted the following in the patient's chart:   Medical and social history Use of alcohol, tobacco or illicit drugs  Current medications and supplements including opioid prescriptions. Patient is not currently taking opioid prescriptions. Functional ability and status Nutritional status Physical activity Advanced directives List of other physicians Hospitalizations, surgeries, and ER visits in previous 12 months Vitals Screenings to include cognitive, depression, and falls Referrals and appointments  In addition, I have reviewed and discussed with patient certain preventive protocols, quality metrics, and best practice recommendations. A written personalized care plan for preventive services as well as general preventive health recommendations were provided to patient.     Kellie Simmering, LPN   [id number]/83/[id number]   Nurse Notes:

## 2020-08-08 ENCOUNTER — Other Ambulatory Visit: Payer: Self-pay | Admitting: Family Medicine

## 2020-08-08 DIAGNOSIS — E785 Hyperlipidemia, unspecified: Secondary | ICD-10-CM

## 2020-08-08 NOTE — Telephone Encounter (Signed)
Requested Prescriptions  Pending Prescriptions Disp Refills  . rosuvastatin (CRESTOR) 10 MG tablet [Pharmacy Med Name: ROSUVASTATIN CALCIUM 10 MG TAB] 24 tablet 0    Sig: TAKE 1 TABLET (10 MG TOTAL) BY MOUTH 2 (TWO) TIMES A WEEK.     Cardiovascular:  Antilipid - Statins Failed - 08/08/2020  1:20 AM      Failed - Total Cholesterol in normal range and within 360 days    Cholesterol, Total  Date Value Ref Range Status  11/01/2014 170 100 - 199 mg/dL Final   Cholesterol  Date Value Ref Range Status  07/28/2019 161 <200 mg/dL Final         Failed - LDL in normal range and within 360 days    LDL Cholesterol (Calc)  Date Value Ref Range Status  07/28/2019 91 mg/dL (calc) Final    Comment:    Reference range: <100 . Desirable range <100 mg/dL for primary prevention;   <70 mg/dL for patients with CHD or diabetic patients  with > or = 2 CHD risk factors. Marland Kitchen LDL-C is now calculated using the Martin-Hopkins  calculation, which is a validated novel method providing  better accuracy than the Friedewald equation in the  estimation of LDL-C.  Cresenciano Genre et al. Annamaria Helling. 0938;182(99): 2061-2068  (http://education.[employer].com/faq/FAQ164)          Failed - HDL in normal range and within 360 days    HDL  Date Value Ref Range Status  07/28/2019 51 > OR = 40 mg/dL Final  11/01/2014 50 >39 mg/dL Final    Comment:    According to ATP-III Guidelines, HDL-C >59 mg/dL is considered a negative risk factor for CHD.          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

## 2020-08-30 ENCOUNTER — Other Ambulatory Visit: Payer: Medicare HMO

## 2020-08-30 ENCOUNTER — Other Ambulatory Visit: Payer: Self-pay

## 2020-08-30 DIAGNOSIS — Z0000 Encounter for general adult medical examination without abnormal findings: Secondary | ICD-10-CM | POA: Diagnosis not present

## 2020-08-30 DIAGNOSIS — I700 Atherosclerosis of aorta: Secondary | ICD-10-CM

## 2020-08-30 DIAGNOSIS — M06041 Rheumatoid arthritis without rheumatoid factor, right hand: Secondary | ICD-10-CM | POA: Diagnosis not present

## 2020-08-30 DIAGNOSIS — M06042 Rheumatoid arthritis without rheumatoid factor, left hand: Secondary | ICD-10-CM | POA: Diagnosis not present

## 2020-08-30 DIAGNOSIS — E785 Hyperlipidemia, unspecified: Secondary | ICD-10-CM

## 2020-08-30 DIAGNOSIS — R972 Elevated prostate specific antigen [PSA]: Secondary | ICD-10-CM | POA: Diagnosis not present

## 2020-08-30 DIAGNOSIS — J432 Centrilobular emphysema: Secondary | ICD-10-CM | POA: Diagnosis not present

## 2020-08-30 DIAGNOSIS — R7309 Other abnormal glucose: Secondary | ICD-10-CM | POA: Diagnosis not present

## 2020-08-31 LAB — LIPID PANEL
Cholesterol: 208 mg/dL — ABNORMAL HIGH (ref <200)
HDL: 64 mg/dL (ref >=40)
LDL Cholesterol (Calc): 120 mg/dL (calc) — ABNORMAL HIGH
Non-HDL Cholesterol (Calc): 144 mg/dL (calc) — ABNORMAL HIGH (ref <130)
Total CHOL/HDL Ratio: 3.3 (calc) (ref <5.0)
Triglycerides: 125 mg/dL (ref <150)

## 2020-08-31 LAB — COMPLETE METABOLIC PANEL WITH GFR
AG Ratio: 2.3 (calc) (ref 1.0–2.5)
ALT: 14 U/L (ref 9–46)
AST: 19 U/L (ref 10–35)
Albumin: 4.5 g/dL (ref 3.6–5.1)
Alkaline phosphatase (APISO): 82 U/L (ref 35–144)
BUN: 18 mg/dL (ref 7–25)
CO2: 29 mmol/L (ref 20–32)
Calcium: 9.7 mg/dL (ref 8.6–10.3)
Chloride: 104 mmol/L (ref 98–110)
Creat: 1.04 mg/dL (ref 0.70–1.18)
GFR, Est African American: 82 mL/min/{1.73_m2} (ref >=60)
GFR, Est Non African American: 71 mL/min/{1.73_m2} (ref >=60)
Globulin: 2 g/dL (calc) (ref 1.9–3.7)
Glucose, Bld: 90 mg/dL (ref 65–99)
Potassium: 4.4 mmol/L (ref 3.5–5.3)
Sodium: 140 mmol/L (ref 135–146)
Total Bilirubin: 0.8 mg/dL (ref 0.2–1.2)
Total Protein: 6.5 g/dL (ref 6.1–8.1)

## 2020-08-31 LAB — HEMOGLOBIN A1C
Hgb A1c MFr Bld: 5.3 % of total Hgb (ref <5.7)
Mean Plasma Glucose: 105 mg/dL
eAG (mmol/L): 5.8 mmol/L

## 2020-08-31 LAB — CBC WITH DIFFERENTIAL/PLATELET
Absolute Monocytes: 567 cells/uL (ref 200–950)
Basophils Absolute: 58 cells/uL (ref 0–200)
Basophils Relative: 1.1 %
Eosinophils Absolute: 101 cells/uL (ref 15–500)
Eosinophils Relative: 1.9 %
HCT: 48.8 % (ref 38.5–50.0)
Hemoglobin: 15.7 g/dL (ref 13.2–17.1)
Lymphs Abs: 1134 cells/uL (ref 850–3900)
MCH: 29.9 pg (ref 27.0–33.0)
MCHC: 32.2 g/dL (ref 32.0–36.0)
MCV: 93 fL (ref 80.0–100.0)
MPV: 12 fL (ref 7.5–12.5)
Monocytes Relative: 10.7 %
Neutro Abs: 3440 cells/uL (ref 1500–7800)
Neutrophils Relative %: 64.9 %
Platelets: 223 10*3/uL (ref 140–400)
RBC: 5.25 10*6/uL (ref 4.20–5.80)
RDW: 12.8 % (ref 11.0–15.0)
Total Lymphocyte: 21.4 %
WBC: 5.3 10*3/uL (ref 3.8–10.8)

## 2020-08-31 LAB — PSA: PSA: 4.21 ng/mL — ABNORMAL HIGH (ref <=4.00)

## 2020-08-31 LAB — TSH: TSH: 2.52 mIU/L (ref 0.40–4.50)

## 2020-09-06 ENCOUNTER — Encounter: Payer: Self-pay | Admitting: Family Medicine

## 2020-09-06 ENCOUNTER — Other Ambulatory Visit: Payer: Self-pay

## 2020-09-06 ENCOUNTER — Ambulatory Visit (INDEPENDENT_AMBULATORY_CARE_PROVIDER_SITE_OTHER): Payer: Medicare HMO | Admitting: Family Medicine

## 2020-09-06 VITALS — BP 124/65 | HR 90 | Ht 67.0 in | Wt 143.6 lb

## 2020-09-06 DIAGNOSIS — Z0000 Encounter for general adult medical examination without abnormal findings: Secondary | ICD-10-CM

## 2020-09-06 DIAGNOSIS — M06042 Rheumatoid arthritis without rheumatoid factor, left hand: Secondary | ICD-10-CM

## 2020-09-06 DIAGNOSIS — R69 Illness, unspecified: Secondary | ICD-10-CM | POA: Diagnosis not present

## 2020-09-06 DIAGNOSIS — I700 Atherosclerosis of aorta: Secondary | ICD-10-CM

## 2020-09-06 DIAGNOSIS — M06041 Rheumatoid arthritis without rheumatoid factor, right hand: Secondary | ICD-10-CM

## 2020-09-06 DIAGNOSIS — J302 Other seasonal allergic rhinitis: Secondary | ICD-10-CM

## 2020-09-06 DIAGNOSIS — J432 Centrilobular emphysema: Secondary | ICD-10-CM | POA: Diagnosis not present

## 2020-09-06 DIAGNOSIS — E785 Hyperlipidemia, unspecified: Secondary | ICD-10-CM

## 2020-09-06 DIAGNOSIS — F411 Generalized anxiety disorder: Secondary | ICD-10-CM

## 2020-09-06 MED ORDER — BUPROPION HCL ER (XL) 150 MG PO TB24: 150.0000 mg | ORAL_TABLET | Freq: Every day | ORAL | 1 refills | Status: DC

## 2020-09-06 MED ORDER — ROSUVASTATIN CALCIUM 10 MG PO TABS
10.0000 mg | ORAL_TABLET | ORAL | 3 refills | Status: DC
Start: 2020-09-06 — End: 2021-09-05

## 2020-09-06 NOTE — Assessment & Plan Note (Signed)
Stable without exacerbation  - recent flare up recently has been treated Followed by [facility] Dr Donell Beers on Low Dose Lung CA Screen CT yearly last 06/2020 Former smoker, now second hand smoke  Chronic sinusitis / post nasal drainage often triggers some upper airway symptoms for him  Plan: 1. Follow-up with Dr Raul Del as planned - Continues on Incruse Ellipta, Flovent, Albuterol - Continue sinus treatments - Flonase, Atrovent, Azelastine sprays

## 2020-09-06 NOTE — Patient Instructions (Addendum)
Thank you for coming to the office today.  Start Wellbutrin XL 150mg  daily for mood/anxiety - trial for up to 3 months and we can continue longer if helpful.  Refilled Statin.  Keep up with Dr Raul Del to get refills on inhalers.   Please schedule a Follow-up Appointment to: Return in about 6 months (around 03/09/2021) for 6 month follow-up COPD/Emphysema (pulm f/u), Anxiety GAD.  If you have any other questions or concerns, please feel free to call the office or send a message through [place]. You may also schedule an earlier appointment if necessary.  Additionally, you may be receiving a survey about your experience at our office within a few days to 1 week by e-mail or mail. We value your feedback.  Nobie Putnam, DO [place]

## 2020-09-06 NOTE — Assessment & Plan Note (Signed)
Stable with occasional episodic flares w/ inc repetitive activity Continue infrequent NSAID for now - Aleve OTC intermittent only, he is aware of risks on these meds Off muscle relaxants now Temporary relief on Voltaren topical but not strong enough to provide much relief Using R forearm brace/support

## 2020-09-06 NOTE — Assessment & Plan Note (Signed)
Controlled on intermittent statni Failed Atorvastatin 10mg myalgias Calculated ASCVD 10 yr risk score 15-19%, former smoker  Plan: 1. Continue Rosuvastatin 10mg TWICE weekly - highest tolerated dose, failed 3 times weekly 2. Continue ASA 81mg for primary ASCVD risk reduction 3. Encourage improved lifestyle - low carb/cholesterol, reduce portion size, continue improving regular exercise 4. Follow-up yearly lipids 

## 2020-09-06 NOTE — Assessment & Plan Note (Signed)
Stable without complication Identified on CT imaging 06/2020 Asymptomatic currently On intermittent statin therapy to control cholesterol reduce ASCVD risk

## 2020-09-06 NOTE — Progress Notes (Signed)
Subjective:    Patient ID: Derek Mayo, male    DOB: [birth year], 75 y.o.   MRN: [id number]  Derek Mayo is a 75 y.o. male presenting on 09/06/2020 for Annual Exam   HPI  Here for Annual Physical and Lab Review.  Centrilobular Emphysema COPD Severe Stage IV  // Allergic Rhinitis Followed by [facility] Pulmonology Dr Raul Del, continued on current regimen with Incruse, Albuterol, Flovent, and Flonase, Nasal Saline. - Today he reports doing well. No recent flare. overall improved with breathing with weight loss, pulse ox 96% - He uses Azelastine usually at night PRN. He uses ventolin PRN. He will use Flovent and Flonase daily. He also has Atrovent decongestant spray PRN. He will consider Pseudophed PRN only if need decongestant - He will follow-up with Dr Raul Del q 6 months  HYPERLIPIDEMIA: - Reports no concerns. Last lipid panel 08/2020 mostly controlled on lipids - Currently taking Rosuvastatin intermittent x 2 weekly  Acute respiratory course, treated with Antibiotic Doxycycline and Prednisone, they tested for COVID and other causes and all negative. 3 weeks symptoms came back, and took expectorant only He still has issues with breathing, worse later in afternoon. Has follow-up with Dr Vella Kohler in 2 weeks Has had lung scan with good results, X-ray. He continues Incruse, Flovent inhalers, and also nasal spray Azelastine, Fluticasone  He has some R wrist symptoms still, wearing brace for support.  Admits tremors Can get worse with anxiety  He feels his breathing is worse with anxiety. Interested in restarting Wellbutrin again, this helped him before for mood/anxiety He is waiting on getting back into house.   PROSTATE SCREENING / Elevated PSA / Mild BPH Last PSA at 4.2 (3.5 to 4.2 Currently with mild BPH LUTS, see AUA score. reports KNOWN family history of prostate CA, younger brother dx with mild prostate cancer s/p treated. Admits some nocturia - No formal dx of BPH in  past - Never on medications   Depression screen [facility] 2/9 08/07/2020 02/03/2020 08/04/2019  Decreased Interest 0 0 0  Down, Depressed, Hopeless 2 0 0  PHQ - 2 Score 2 0 0  Altered sleeping 3 - -  Tired, decreased energy 3 - -  Change in appetite 1 - -  Feeling bad or failure about yourself  1 - -  Trouble concentrating 0 - -  Moving slowly or fidgety/restless 0 - -  Suicidal thoughts 0 - -  PHQ-9 Score 10 - -  Difficult doing work/chores Somewhat difficult - -  Some recent data might be hidden   No flowsheet data found.   Past Medical History:  Diagnosis Date   Allergic rhinitis    Anemia    Chicken pox    COPD (chronic obstructive pulmonary disease) (HCC)    Depression    Measles    Mumps    Premature beats    Past Surgical History:  Procedure Laterality Date   CHEST SURGERY     COLONOSCOPY WITH PROPOFOL N/A 06/25/2015   Procedure: COLONOSCOPY WITH PROPOFOL;  Surgeon: Josefine Class, MD;  Location: [facility] ENDOSCOPY;  Service: Endoscopy;  Laterality: N/A;   KNEE SURGERY Left    removed broken cartilage   TONSILECTOMY/ADENOIDECTOMY WITH MYRINGOTOMY     TONSILLECTOMY     Social History   Socioeconomic History   Marital status: Married    Spouse name: Not on file   Number of children: Not on file   Years of education: Not on file   Highest education level: Not on file  Occupational History   Not on file  Tobacco Use   Smoking status: Former    Packs/day: 1.00    Years: 40.00    Pack years: 40.00    Types: Cigars, Cigarettes    Quit date: 08/13/2012    Years since quitting: 8.0   Smokeless tobacco: Former  Scientific laboratory technician Use: Never used  Substance and Sexual Activity   Alcohol use: Not Currently    Alcohol/week: 7.0 standard drinks    Types: 7 Cans of beer per week   Drug use: Not Currently   Sexual activity: Not on file  Other Topics Concern   Not on file  Social History Narrative   Not on file   Social Determinants of Health   Financial  Resource Strain: Low Risk    Difficulty of Paying Living Expenses: Not hard at all  Food Insecurity: No Food Insecurity   Worried About Charity fundraiser in the Last Year: Never true   [place] in the Last Year: Never true  Transportation Needs: No Transportation Needs   Lack of Transportation (Medical): No   Lack of Transportation (Non-Medical): No  Physical Activity: Inactive   Days of Exercise per Week: 0 days   Minutes of Exercise per Session: 0 min  Stress: Stress Concern Present   Feeling of Stress : To some extent  Social Connections: Not on file  Intimate Partner Violence: Not on file   Family History  Problem Relation Age of Onset   Heart disease Father    AAA (abdominal aortic aneurysm) Brother    Prostate cancer Brother    Current Outpatient Medications on File Prior to Visit  Medication Sig   albuterol (PROVENTIL) (2.5 MG/3ML) 0.083% nebulizer solution Inhale 0.083 mLs into the lungs every other day.   albuterol (VENTOLIN HFA) 108 (90 Base) MCG/ACT inhaler INHALE 2 PUFFS 4 TIMES A DAY AS NEEDED   aspirin EC 81 MG tablet Take 81 mg by mouth daily.   Azelastine HCl 0.15 % SOLN Place 1 spray into both nostrils daily as needed.   fluticasone (FLONASE) 50 MCG/ACT nasal spray SPRAY 2 SPRAYS INTO EACH NOSTRIL EVERY DAY   fluticasone (FLOVENT HFA) 110 MCG/ACT inhaler INHALE 1 PUFF TWICE A DAY   ipratropium (ATROVENT) 0.03 % nasal spray USE 1 SPRAY INTO BOTH NOSTRILS DAILY AS NEEDED.   naproxen (NAPROSYN) 250 MG tablet Take 250 mg by mouth daily as needed.   pseudoephedrine (SUDAFED) 30 MG tablet Take 30 mg by mouth every 4 (four) hours as needed for congestion.   SHINGRIX injection    umeclidinium bromide (INCRUSE ELLIPTA) 62.5 MCG/INH AEPB INHALE 1 INHALATION INTO THE LUNGS ONCE DAILY.   No current facility-administered medications on file prior to visit.    Review of Systems  Constitutional:  Negative for activity change, appetite change, chills, diaphoresis,  fatigue and fever.  HENT:  Negative for congestion and hearing loss.   Eyes:  Negative for visual disturbance.  Respiratory:  Negative for cough, chest tightness, shortness of breath and wheezing.   Cardiovascular:  Negative for chest pain, palpitations and leg swelling.  Gastrointestinal:  Negative for abdominal pain, constipation, diarrhea, nausea and vomiting.  Genitourinary:  Negative for dysuria, frequency and hematuria.  Musculoskeletal:  Negative for arthralgias and neck pain.  Skin:  Negative for rash.  Neurological:  Negative for dizziness, weakness, light-headedness, numbness and headaches.  Hematological:  Negative for adenopathy.  Psychiatric/Behavioral:  Negative for behavioral problems, dysphoric mood and  sleep disturbance.   Per HPI unless specifically indicated above      Objective:    BP 124/65   Pulse 90   Ht 5\' 7"  (1.702 m)   Wt 143 lb 9.6 oz (65.1 kg)   SpO2 98%   BMI 22.49 kg/m   Wt Readings from Last 3 Encounters:  09/06/20 143 lb 9.6 oz (65.1 kg)  08/07/20 135 lb (61.2 kg)  07/12/20 135 lb (61.2 kg)    Physical Exam Vitals and nursing note reviewed.  Constitutional:      General: He is not in acute distress.    Appearance: He is well-developed. He is not diaphoretic.     Comments: Well-appearing, comfortable, cooperative  HENT:     Head: Normocephalic and atraumatic.  Eyes:     General:        Right eye: No discharge.        Left eye: No discharge.     Conjunctiva/sclera: Conjunctivae normal.     Pupils: Pupils are equal, round, and reactive to light.  Neck:     Thyroid: No thyromegaly.     Vascular: No carotid bruit.  Cardiovascular:     Rate and Rhythm: Normal rate and regular rhythm.     Pulses: Normal pulses.     Heart sounds: Normal heart sounds. No murmur heard. Pulmonary:     Effort: Pulmonary effort is normal. No respiratory distress.     Breath sounds: Normal breath sounds. No wheezing or rales.  Abdominal:     General: Bowel  sounds are normal. There is no distension.     Palpations: Abdomen is soft. There is no mass.     Tenderness: There is no abdominal tenderness.  Musculoskeletal:        General: No tenderness. Normal range of motion.     Cervical back: Normal range of motion and neck supple.     Right lower leg: No edema.     Left lower leg: No edema.     Comments: Upper / Lower Extremities: - Normal muscle tone, strength bilateral upper extremities 5/5, lower extremities 5/5  Lymphadenopathy:     Cervical: No cervical adenopathy.  Skin:    General: Skin is warm and dry.     Findings: No erythema or rash.  Neurological:     Mental Status: He is alert and oriented to person, place, and time.     Comments: Distal sensation intact to light touch all extremities  Psychiatric:        Mood and Affect: Mood normal.        Behavior: Behavior normal.        Thought Content: Thought content normal.     Comments: Well groomed, good eye contact, normal speech and thoughts    I have personally reviewed the radiology report from 07/12/20 LDCT.  CLINICAL DATA:  75 year old male former smoker (quit 8 years ago) with 40 pack-year history of smoking. Lung cancer screening examination.   EXAM: CT CHEST WITHOUT CONTRAST LOW-DOSE FOR LUNG CANCER SCREENING   TECHNIQUE: Multidetector CT imaging of the chest was performed following the standard protocol without IV contrast.   COMPARISON:  Low-dose lung cancer screening chest CT 06/30/2019.   FINDINGS: Cardiovascular: Heart size is normal. There is no significant pericardial fluid, thickening or pericardial calcification. There is aortic atherosclerosis, as well as atherosclerosis of the great vessels of the mediastinum and the coronary arteries, including calcified atherosclerotic plaque in the left main coronary artery. Calcifications of the aortic  valve.   Mediastinum/Nodes: No pathologically enlarged mediastinal or hilar lymph nodes. Please note that  accurate exclusion of hilar adenopathy is limited on noncontrast CT scans. Esophagus is unremarkable in appearance. No axillary lymphadenopathy.   Lungs/Pleura: Small pulmonary nodules are again noted in the lungs, largest of which is in the medial aspect of the right upper lobe (axial image 101 of series 3), with a volume derived mean diameter of only 2.5 mm. No larger more suspicious appearing pulmonary nodules or masses are noted. No acute consolidative airspace disease. No pleural effusions. Diffuse bronchial wall thickening with moderate centrilobular and paraseptal emphysema.   Upper Abdomen: Aortic atherosclerosis.   Musculoskeletal: Multiple low-attenuation lesions in the kidneys bilaterally, largest of which is a 4.4 x 3.3 cm exophytic low-attenuation lesion extending laterally off the interpolar region of the right kidney, incompletely characterized on today's non-contrast CT examination, but similar to prior studies and statistically likely to represent cysts.   IMPRESSION: 1. Lung-RADS 2S, benign appearance or behavior. Continue annual screening with low-dose chest CT without contrast in 12 months. 2. The "S" modifier above refers to potentially clinically significant non lung cancer related findings. Specifically, there is aortic atherosclerosis, in addition to left main coronary artery disease. Please note that although the presence of coronary artery calcium documents the presence of coronary artery disease, the severity of this disease and any potential stenosis cannot be assessed on this non-gated CT examination. Assessment for potential risk factor modification, dietary therapy or pharmacologic therapy may be warranted, if clinically indicated. 3. Mild diffuse bronchial wall thickening with moderate centrilobular and paraseptal emphysema; imaging findings suggestive of underlying COPD. 4. There are calcifications of the aortic valve.  Echocardiographic correlation for evaluation of potential valvular dysfunction may be warranted if clinically indicated.   Aortic Atherosclerosis (ICD10-I70.0) and Emphysema (ICD10-J43.9).     Electronically Signed   By: Vinnie Langton M.D.   On: 07/18/2020 10:37  Results for orders placed or performed in visit on 08/30/20  TSH  Result Value Ref Range   TSH 2.52 0.40 - 4.50 mIU/L  PSA  Result Value Ref Range   PSA 4.21 (H) < OR = 4.00 ng/mL  Lipid panel  Result Value Ref Range   Cholesterol 208 (H) <200 mg/dL   HDL 64 > OR = 40 mg/dL   Triglycerides 125 <150 mg/dL   LDL Cholesterol (Calc) 120 (H) mg/dL (calc)   Total CHOL/HDL Ratio 3.3 <5.0 (calc)   Non-HDL Cholesterol (Calc) 144 (H) <130 mg/dL (calc)  COMPLETE METABOLIC PANEL WITH GFR  Result Value Ref Range   Glucose, Bld 90 65 - 99 mg/dL   BUN 18 7 - 25 mg/dL   Creat 1.04 0.70 - 1.18 mg/dL   GFR, Est Non African American 71 > OR = 60 mL/min/1.78m2   GFR, Est African American 82 > OR = 60 mL/min/1.25m2   BUN/Creatinine Ratio NOT APPLICABLE 6 - 22 (calc)   Sodium 140 135 - 146 mmol/L   Potassium 4.4 3.5 - 5.3 mmol/L   Chloride 104 98 - 110 mmol/L   CO2 29 20 - 32 mmol/L   Calcium 9.7 8.6 - 10.3 mg/dL   Total Protein 6.5 6.1 - 8.1 g/dL   Albumin 4.5 3.6 - 5.1 g/dL   Globulin 2.0 1.9 - 3.7 g/dL (calc)   AG Ratio 2.3 1.0 - 2.5 (calc)   Total Bilirubin 0.8 0.2 - 1.2 mg/dL   Alkaline phosphatase (APISO) 82 35 - 144 U/L   AST 19 10 -  35 U/L   ALT 14 9 - 46 U/L  CBC with Differential/Platelet  Result Value Ref Range   WBC 5.3 3.8 - 10.8 Thousand/uL   RBC 5.25 4.20 - 5.80 Million/uL   Hemoglobin 15.7 13.2 - 17.1 g/dL   HCT 48.8 38.5 - 50.0 %   MCV 93.0 80.0 - 100.0 fL   MCH 29.9 27.0 - 33.0 pg   MCHC 32.2 32.0 - 36.0 g/dL   RDW 12.8 11.0 - 15.0 %   Platelets 223 140 - 400 Thousand/uL   MPV 12.0 7.5 - 12.5 fL   Neutro Abs 3,440 1,500 - 7,800 cells/uL   Lymphs Abs 1,134 850 - 3,900 cells/uL   Absolute Monocytes  567 200 - 950 cells/uL   Eosinophils Absolute 101 15 - 500 cells/uL   Basophils Absolute 58 0 - 200 cells/uL   Neutrophils Relative % 64.9 %   Total Lymphocyte 21.4 %   Monocytes Relative 10.7 %   Eosinophils Relative 1.9 %   Basophils Relative 1.1 %  Hemoglobin A1c  Result Value Ref Range   Hgb A1c MFr Bld 5.3 <5.7 % of total Hgb   Mean Plasma Glucose 105 mg/dL   eAG (mmol/L) 5.8 mmol/L      Assessment & Plan:   Problem List Items Addressed This Visit     Rheumatoid arthritis involving both hands (HCC)    Stable with occasional episodic flares w/ inc repetitive activity Continue infrequent NSAID for now - Aleve OTC intermittent only, he is aware of risks on these meds Off muscle relaxants now Temporary relief on Voltaren topical but not strong enough to provide much relief Using R forearm brace/support       Dyslipidemia    Controlled on intermittent statni Failed Atorvastatin 10mg  myalgias Calculated ASCVD 10 yr risk score 15-19%, former smoker  Plan: 1. Continue Rosuvastatin 10mg  TWICE weekly - highest tolerated dose, failed 3 times weekly 2. Continue ASA 81mg  for primary ASCVD risk reduction 3. Encourage improved lifestyle - low carb/cholesterol, reduce portion size, continue improving regular exercise 4. Follow-up yearly lipids       Relevant Medications   rosuvastatin (CRESTOR) 10 MG tablet   Centrilobular emphysema (HCC)    Stable without exacerbation  - recent flare up recently has been treated Followed by [facility] Pulm Dr Donell Beers on Low Dose Lung CA Screen CT yearly last 06/2020 Former smoker, now second hand smoke  Chronic sinusitis / post nasal drainage often triggers some upper airway symptoms for him  Plan: 1. Follow-up with Dr Raul Del as planned - Continues on Incruse Ellipta, Flovent, Albuterol - Continue sinus treatments - Flonase, Atrovent, Azelastine sprays       Aortic atherosclerosis (HCC)    Stable without complication Identified on  CT imaging 06/2020 Asymptomatic currently On intermittent statin therapy to control cholesterol reduce ASCVD risk       Relevant Medications   rosuvastatin (CRESTOR) 10 MG tablet   Other Visit Diagnoses     Annual physical exam    -  Primary   GAD (generalized anxiety disorder)       Relevant Medications   buPROPion (WELLBUTRIN XL) 150 MG 24 hr tablet   Chronic seasonal allergic rhinitis           Updated Health Maintenance information Reviewed recent lab results with patient Encouraged improvement to lifestyle with diet and exercise Goal healthy weight  GAD/Anxiety Repeat course of Wellbutrin, has helped him before Will trial this to help his breathing, avoid anxiety  flares.   Meds ordered this encounter  Medications   buPROPion (WELLBUTRIN XL) 150 MG 24 hr tablet    Sig: Take 1 tablet (150 mg total) by mouth daily.    Dispense:  90 tablet    Refill:  1   rosuvastatin (CRESTOR) 10 MG tablet    Sig: Take 1 tablet (10 mg total) by mouth 2 (two) times a week.    Dispense:  24 tablet    Refill:  3       Follow up plan: Return in about 6 months (around 03/09/2021) for 6 month follow-up COPD/Emphysema (pulm f/u), Anxiety GAD.  Nobie Putnam, [facility] 09/06/2020, 8:55 AM

## 2020-09-17 DIAGNOSIS — J449 Chronic obstructive pulmonary disease, unspecified: Secondary | ICD-10-CM | POA: Diagnosis not present

## 2020-11-01 ENCOUNTER — Other Ambulatory Visit: Payer: Self-pay | Admitting: Family Medicine

## 2020-11-01 DIAGNOSIS — J302 Other seasonal allergic rhinitis: Secondary | ICD-10-CM

## 2020-11-01 NOTE — Telephone Encounter (Signed)
Requested Prescriptions  Pending Prescriptions Disp Refills  . fluticasone (FLONASE) 50 MCG/ACT nasal spray [Pharmacy Med Name: FLUTICASONE PROP 50 MCG SPRAY] 48 mL 1    Sig: SPRAY 2 SPRAYS INTO EACH NOSTRIL EVERY DAY     Ear, Nose, and Throat: Nasal Preparations - Corticosteroids Passed - 11/01/2020  2:44 AM      Passed - Valid encounter within last 12 months    Recent Outpatient Visits          1 month ago Annual physical exam   [place], DO   9 months ago Rheumatoid arthritis involving both hands with negative rheumatoid factor [facility])   [facility] Parks Ranger, Devonne Doughty, DO   1 year ago Annual physical exam   [facility] Olin Hauser, DO   1 year ago Centrilobular emphysema [facility])   [facility] Olin Hauser, DO   2 years ago Annual physical exam   [place], DO      Future Appointments            In 4 months [place], Devonne Doughty, [facility], [place]   In 9 months  [facility], [facility]

## 2020-11-05 DIAGNOSIS — G8929 Other chronic pain: Secondary | ICD-10-CM | POA: Diagnosis not present

## 2020-11-05 DIAGNOSIS — R69 Illness, unspecified: Secondary | ICD-10-CM | POA: Diagnosis not present

## 2020-11-05 DIAGNOSIS — J439 Emphysema, unspecified: Secondary | ICD-10-CM | POA: Diagnosis not present

## 2020-11-05 DIAGNOSIS — M069 Rheumatoid arthritis, unspecified: Secondary | ICD-10-CM | POA: Diagnosis not present

## 2020-11-05 DIAGNOSIS — E785 Hyperlipidemia, unspecified: Secondary | ICD-10-CM | POA: Diagnosis not present

## 2020-11-05 DIAGNOSIS — M3500 Sicca syndrome, unspecified: Secondary | ICD-10-CM | POA: Diagnosis not present

## 2020-11-05 DIAGNOSIS — J302 Other seasonal allergic rhinitis: Secondary | ICD-10-CM | POA: Diagnosis not present

## 2020-11-05 DIAGNOSIS — I700 Atherosclerosis of aorta: Secondary | ICD-10-CM | POA: Diagnosis not present

## 2020-11-05 DIAGNOSIS — I739 Peripheral vascular disease, unspecified: Secondary | ICD-10-CM | POA: Diagnosis not present

## 2020-11-20 ENCOUNTER — Other Ambulatory Visit: Payer: Self-pay

## 2020-11-20 ENCOUNTER — Ambulatory Visit (INDEPENDENT_AMBULATORY_CARE_PROVIDER_SITE_OTHER): Payer: Medicare HMO | Admitting: Family Medicine

## 2020-11-20 VITALS — Ht 67.0 in | Wt 143.0 lb

## 2020-11-20 DIAGNOSIS — Z23 Encounter for immunization: Secondary | ICD-10-CM

## 2021-01-17 ENCOUNTER — Other Ambulatory Visit: Payer: Self-pay | Admitting: Family Medicine

## 2021-01-17 DIAGNOSIS — J302 Other seasonal allergic rhinitis: Secondary | ICD-10-CM

## 2021-01-17 NOTE — Telephone Encounter (Signed)
Requested medications are due for refill today.  yes  Requested medications are on the active medications list.  yes  Last refill. 07/02/2020  Future visit scheduled.   yes  Notes to clinic.  Medication not assigned to a protocol.

## 2021-02-26 ENCOUNTER — Other Ambulatory Visit: Payer: Self-pay | Admitting: Family Medicine

## 2021-02-26 DIAGNOSIS — F411 Generalized anxiety disorder: Secondary | ICD-10-CM

## 2021-03-08 ENCOUNTER — Other Ambulatory Visit: Payer: Self-pay

## 2021-03-08 ENCOUNTER — Ambulatory Visit (INDEPENDENT_AMBULATORY_CARE_PROVIDER_SITE_OTHER): Payer: Medicare HMO | Admitting: Family Medicine

## 2021-03-08 ENCOUNTER — Other Ambulatory Visit: Payer: Self-pay | Admitting: Family Medicine

## 2021-03-08 ENCOUNTER — Encounter: Payer: Self-pay | Admitting: Family Medicine

## 2021-03-08 VITALS — BP 126/68 | HR 92 | Ht 67.0 in | Wt 150.4 lb

## 2021-03-08 DIAGNOSIS — F411 Generalized anxiety disorder: Secondary | ICD-10-CM

## 2021-03-08 DIAGNOSIS — J432 Centrilobular emphysema: Secondary | ICD-10-CM

## 2021-03-08 DIAGNOSIS — R972 Elevated prostate specific antigen [PSA]: Secondary | ICD-10-CM

## 2021-03-08 DIAGNOSIS — I700 Atherosclerosis of aorta: Secondary | ICD-10-CM

## 2021-03-08 DIAGNOSIS — R7309 Other abnormal glucose: Secondary | ICD-10-CM

## 2021-03-08 DIAGNOSIS — R69 Illness, unspecified: Secondary | ICD-10-CM | POA: Diagnosis not present

## 2021-03-08 DIAGNOSIS — M069 Rheumatoid arthritis, unspecified: Secondary | ICD-10-CM | POA: Diagnosis not present

## 2021-03-08 DIAGNOSIS — E785 Hyperlipidemia, unspecified: Secondary | ICD-10-CM

## 2021-03-08 DIAGNOSIS — F338 Other recurrent depressive disorders: Secondary | ICD-10-CM | POA: Diagnosis not present

## 2021-03-08 DIAGNOSIS — J302 Other seasonal allergic rhinitis: Secondary | ICD-10-CM

## 2021-03-08 DIAGNOSIS — Z0000 Encounter for general adult medical examination without abnormal findings: Secondary | ICD-10-CM

## 2021-03-08 MED ORDER — BUPROPION HCL ER (XL) 150 MG PO TB24: 150.0000 mg | ORAL_TABLET | Freq: Every day | ORAL | 3 refills | Status: DC

## 2021-03-08 MED ORDER — PSEUDOEPHEDRINE HCL 30 MG PO TABS: 30.0000 mg | ORAL_TABLET | Freq: Every day | ORAL | 2 refills | Status: DC | PRN

## 2021-03-08 NOTE — Assessment & Plan Note (Signed)
Stable without exacerbation Followed by [facility] Dr Fleming Continues on Low Dose Lung CA Screen CT yearly last 06/2020 Former smoker, now second hand smoke  Chronic sinusitis / post nasal drainage often triggers some upper airway symptoms for him  Plan: 1. Follow-up with Dr Fleming as planned - Continues on Incruse Ellipta, Flovent, Albuterol - Continue sinus treatments - Flonase, Atrovent, Azelastine sprays 

## 2021-03-08 NOTE — Assessment & Plan Note (Signed)
Stable Continue current regimen New order rx Sudafed 30mg  daily sent 90 day supply, per pharmacy request since he was taking OTC regularly Continue Azelastine, Nasal saline

## 2021-03-08 NOTE — Patient Instructions (Addendum)
Thank you for coming to the office today.  BPH Over the Counter Herbal - Saw Palmetto 80mg  twice a day for prostate enlarged to help with urinary flow. If not helpful go up to 2 capsules 160mg  twice a day.  Refilled Wellbutrin  DUE for FASTING BLOOD WORK (no food or drink after midnight before the lab appointment, only water or coffee without cream/sugar on the morning of)  SCHEDULE "Lab Only" visit in the morning at the clinic for lab draw in 6 MONTHS   - Make sure Lab Only appointment is at about 1 week before your next appointment, so that results will be available  For Lab Results, once available within 2-3 days of blood draw, you can can log in to MyChart online to view your results and a brief explanation. Also, we can discuss results at next follow-up visit.  Please schedule a Follow-up Appointment to: Return in about 6 months (around 09/05/2021) for 6 month fasting lab only then 1 week later Annual Physical.  If you have any other questions or concerns, please feel free to call the office or send a message through [facility]. You may also schedule an earlier appointment if necessary.  Additionally, you may be receiving a survey about your experience at our office within a few days to 1 week by e-mail or mail. We value your feedback.  Nobie Putnam, DO [place]

## 2021-03-08 NOTE — Assessment & Plan Note (Signed)
Stable without complication Identified on CT imaging 06/2020 Asymptomatic currently On intermittent statin therapy to control cholesterol reduce ASCVD risk

## 2021-03-08 NOTE — Assessment & Plan Note (Signed)
Stable with occasional episodic flares w/ inc repetitive activity Continue infrequent NSAID for now - Aleve OTC intermittent only, he is aware of risks on these meds Off muscle relaxants now Temporary relief on Voltaren topical but not strong enough to provide much relief Using R forearm brace/support / Wrist Widget, Forearm strap

## 2021-03-08 NOTE — Progress Notes (Signed)
Subjective:    Patient ID: Derek Mayo, male    DOB: 11-Dec-1945, 76 y.o.   MRN: [id number]  Derek Mayo is a 76 y.o. male presenting on 03/08/2021 for COPD and Anxiety   HPI  GAD Improved on Wellbutrin. He had major stressors recently with move and also has some seasonal affective concerns. He would like to continue Wellbutrin. Wife has had recent cancer history and this has been a stressor.  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 If he is working more aggressively with arm and wrist, he would use a heavier brace for wrist. Also wears forearm strap. - He has modified his instruments with clarinet and flute to do lighter weight so it does not interfere with him playing now, he is doing well. Not interested in tremor medication He has no resting tremor  Centrilobular Emphysema COPD Severe Stage IV  // Allergic Rhinitis Followed by [facility] Dr Raul Del, continued on current regimen with Incruse, Albuterol, Flovent, and Flonase, Nasal Saline. - Today he reports doing well. No recent flare - He uses Azelastine usually at night PRN. He uses ventolin PRN. He will use Flovent and Flonase daily. He also has Atrovent decongestant spray PRN. He will consider Pseudophed PRN only if need decongestant - He will follow-up with Dr Raul Del soon LDCT Screen scan 06/2021  Atherosclerosis on CT 06/2020   PROSTATE SCREENING / Elevated PSA / Mild BPH Prior PSA trend 4 range Increasing nocturia, incomplete emptying.  Interested in medicine Currently with mild BPH LUTS, see AUA score. reports KNOWN family history of prostate CA, younger brother dx with mild prostate cancer s/p treated. - Never on medications   Depression screen [facility] 2/9 03/08/2021 08/07/2020 02/03/2020  Decreased Interest 0 0 0  Down, Depressed, Hopeless  0 2 0  PHQ - 2 Score 0 2 0  Altered sleeping 0 3 -  Tired, decreased energy 0 3 -  Change in appetite 0 1 -  Feeling bad or failure about yourself  0 1 -  Trouble concentrating 0 0 -  Moving slowly or fidgety/restless 0 0 -  Suicidal thoughts 0 0 -  PHQ-9 Score 0 10 -  Difficult doing work/chores Not difficult at all Somewhat difficult -  Some recent data might be hidden    Social History   Tobacco Use   Smoking status: Former    Packs/day: 1.00    Years: 40.00    Pack years: 40.00    Types: Cigars, Cigarettes    Quit date: 08/13/2012    Years since quitting: 8.5   Smokeless tobacco: Former  Scientific laboratory technician Use: Never used  Substance Use Topics   Alcohol use: Not Currently    Alcohol/week: 7.0 standard drinks    Types: 7 Cans of beer per week   Drug use: Not Currently    Review of Systems Per HPI unless specifically indicated above     Objective:    BP 126/68    Pulse 92    Ht 5\' 7"  (1.702 m)    Wt 150 lb 6.4 oz (68.2 kg)    SpO2 97%    BMI 23.56 kg/m   Wt Readings from Last 3 Encounters:  03/08/21 150 lb 6.4 oz (68.2 kg)  11/20/20 143 lb (64.9 kg)  09/06/20 143 lb 9.6 oz (65.1 kg)  Physical Exam Vitals and nursing note reviewed.  Constitutional:      General: He is not in acute distress.    Appearance: He is well-developed. He is not diaphoretic.     Comments: Well-appearing, comfortable, cooperative  HENT:     Head: Normocephalic and atraumatic.  Eyes:     General:        Right eye: No discharge.        Left eye: No discharge.     Conjunctiva/sclera: Conjunctivae normal.  Neck:     Thyroid: No thyromegaly.  Cardiovascular:     Rate and Rhythm: Normal rate and regular rhythm.     Pulses: Normal pulses.     Heart sounds: Normal heart sounds. No murmur heard. Pulmonary:     Effort: Pulmonary effort is normal. No respiratory distress.     Breath sounds: Normal breath sounds. No wheezing or rales.  Musculoskeletal:        General: Normal range  of motion.     Cervical back: Normal range of motion and neck supple.  Lymphadenopathy:     Cervical: No cervical adenopathy.  Skin:    General: Skin is warm and dry.     Findings: No erythema or rash.  Neurological:     Mental Status: He is alert and oriented to person, place, and time. Mental status is at baseline.  Psychiatric:        Behavior: Behavior normal.     Comments: Well groomed, good eye contact, normal speech and thoughts    I have personally reviewed the radiology report from May 2022  CLINICAL DATA:  76 year old male former smoker (quit 8 years ago) with 40 pack-year history of smoking. Lung cancer screening examination.   EXAM: CT CHEST WITHOUT CONTRAST LOW-DOSE FOR LUNG CANCER SCREENING   TECHNIQUE: Multidetector CT imaging of the chest was performed following the standard protocol without IV contrast.   COMPARISON:  Low-dose lung cancer screening chest CT 06/30/2019.   FINDINGS: Cardiovascular: Heart size is normal. There is no significant pericardial fluid, thickening or pericardial calcification. There is aortic atherosclerosis, as well as atherosclerosis of the great vessels of the mediastinum and the coronary arteries, including calcified atherosclerotic plaque in the left main coronary artery. Calcifications of the aortic valve.   Mediastinum/Nodes: No pathologically enlarged mediastinal or hilar lymph nodes. Please note that accurate exclusion of hilar adenopathy is limited on noncontrast CT scans. Esophagus is unremarkable in appearance. No axillary lymphadenopathy.   Lungs/Pleura: Small pulmonary nodules are again noted in the lungs, largest of which is in the medial aspect of the right upper lobe (axial image 101 of series 3), with a volume derived mean diameter of only 2.5 mm. No larger more suspicious appearing pulmonary nodules or masses are noted. No acute consolidative airspace disease. No pleural effusions. Diffuse bronchial wall  thickening with moderate centrilobular and paraseptal emphysema.   Upper Abdomen: Aortic atherosclerosis.   Musculoskeletal: Multiple low-attenuation lesions in the kidneys bilaterally, largest of which is a 4.4 x 3.3 cm exophytic low-attenuation lesion extending laterally off the interpolar region of the right kidney, incompletely characterized on today's non-contrast CT examination, but similar to prior studies and statistically likely to represent cysts.   IMPRESSION: 1. Lung-RADS 2S, benign appearance or behavior. Continue annual screening with low-dose chest CT without contrast in 12 months. 2. The "S" modifier above refers to potentially clinically significant non lung cancer related findings. Specifically, there is aortic atherosclerosis, in addition to left main coronary artery disease. Please  note that although the presence of coronary artery calcium documents the presence of coronary artery disease, the severity of this disease and any potential stenosis cannot be assessed on this non-gated CT examination. Assessment for potential risk factor modification, dietary therapy or pharmacologic therapy may be warranted, if clinically indicated. 3. Mild diffuse bronchial wall thickening with moderate centrilobular and paraseptal emphysema; imaging findings suggestive of underlying COPD. 4. There are calcifications of the aortic valve. Echocardiographic correlation for evaluation of potential valvular dysfunction may be warranted if clinically indicated.   Aortic Atherosclerosis (ICD10-I70.0) and Emphysema (ICD10-J43.9).     Electronically Signed   By: Vinnie Langton M.D.   On: 07/18/2020 10:37  Results for orders placed or performed in visit on 08/30/20  TSH  Result Value Ref Range   TSH 2.52 0.40 - 4.50 mIU/L  PSA  Result Value Ref Range   PSA 4.21 (H) < OR = 4.00 ng/mL  Lipid panel  Result Value Ref Range   Cholesterol 208 (H) <200 mg/dL   HDL 64 > OR = 40 mg/dL    Triglycerides 125 <150 mg/dL   LDL Cholesterol (Calc) 120 (H) mg/dL (calc)   Total CHOL/HDL Ratio 3.3 <5.0 (calc)   Non-HDL Cholesterol (Calc) 144 (H) <130 mg/dL (calc)  COMPLETE METABOLIC PANEL WITH GFR  Result Value Ref Range   Glucose, Bld 90 65 - 99 mg/dL   BUN 18 7 - 25 mg/dL   Creat 1.04 0.70 - 1.18 mg/dL   GFR, Est Non African American 71 > OR = 60 mL/min/1.57m2   GFR, Est African American 82 > OR = 60 mL/min/1.46m2   BUN/Creatinine Ratio NOT APPLICABLE 6 - 22 (calc)   Sodium 140 135 - 146 mmol/L   Potassium 4.4 3.5 - 5.3 mmol/L   Chloride 104 98 - 110 mmol/L   CO2 29 20 - 32 mmol/L   Calcium 9.7 8.6 - 10.3 mg/dL   Total Protein 6.5 6.1 - 8.1 g/dL   Albumin 4.5 3.6 - 5.1 g/dL   Globulin 2.0 1.9 - 3.7 g/dL (calc)   AG Ratio 2.3 1.0 - 2.5 (calc)   Total Bilirubin 0.8 0.2 - 1.2 mg/dL   Alkaline phosphatase (APISO) 82 35 - 144 U/L   AST 19 10 - 35 U/L   ALT 14 9 - 46 U/L  CBC with Differential/Platelet  Result Value Ref Range   WBC 5.3 3.8 - 10.8 Thousand/uL   RBC 5.25 4.20 - 5.80 Million/uL   Hemoglobin 15.7 13.2 - 17.1 g/dL   HCT 48.8 38.5 - 50.0 %   MCV 93.0 80.0 - 100.0 fL   MCH 29.9 27.0 - 33.0 pg   MCHC 32.2 32.0 - 36.0 g/dL   RDW 12.8 11.0 - 15.0 %   Platelets 223 140 - 400 Thousand/uL   MPV 12.0 7.5 - 12.5 fL   Neutro Abs 3,440 1,500 - 7,800 cells/uL   Lymphs Abs 1,134 850 - 3,900 cells/uL   Absolute Monocytes 567 200 - 950 cells/uL   Eosinophils Absolute 101 15 - 500 cells/uL   Basophils Absolute 58 0 - 200 cells/uL   Neutrophils Relative % 64.9 %   Total Lymphocyte 21.4 %   Monocytes Relative 10.7 %   Eosinophils Relative 1.9 %   Basophils Relative 1.1 %  Hemoglobin A1c  Result Value Ref Range   Hgb A1c MFr Bld 5.3 <5.7 % of total Hgb   Mean Plasma Glucose 105 mg/dL   eAG (mmol/L) 5.8 mmol/L  Assessment & Plan:   Problem List Items Addressed This Visit     Seasonal affective disorder ([place])    Stable to improved On Wellbutrin        Relevant Medications   buPROPion (WELLBUTRIN XL) 150 MG 24 hr tablet   Rheumatoid arthritis involving both hands (HCC)    Stable with occasional episodic flares w/ inc repetitive activity Continue infrequent NSAID for now - Aleve OTC intermittent only, he is aware of risks on these meds Off muscle relaxants now Temporary relief on Voltaren topical but not strong enough to provide much relief Using R forearm brace/support / Wrist Widget, Forearm strap      GAD (generalized anxiety disorder) - Primary    Controlled w/ lifestyle changes improvements On Wellbutrin with improvement      Relevant Medications   buPROPion (WELLBUTRIN XL) 150 MG 24 hr tablet   Chronic seasonal allergic rhinitis    Stable Continue current regimen New order rx Sudafed 30mg  daily sent 90 day supply, per pharmacy request since he was taking OTC regularly Continue Azelastine, Nasal saline      Relevant Medications   pseudoephedrine (SUDAFED) 30 MG tablet   Centrilobular emphysema (HCC)    Stable without exacerbation Followed by [facility] Pulm Dr Donell Beers on Low Dose Lung CA Screen CT yearly last 06/2020 Former smoker, now second hand smoke  Chronic sinusitis / post nasal drainage often triggers some upper airway symptoms for him  Plan: 1. Follow-up with Dr Raul Del as planned - Continues on Incruse Ellipta, Flovent, Albuterol - Continue sinus treatments - Flonase, Atrovent, Azelastine sprays      Relevant Medications   pseudoephedrine (SUDAFED) 30 MG tablet   Aortic atherosclerosis (HCC)    Stable without complication Identified on CT imaging 06/2020 Asymptomatic currently On intermittent statin therapy to control cholesterol reduce ASCVD risk       Aortic Atherosclerosis Stable, identified on CT yearly 06/2020 On Statin ASA  BPH LUTS Increasing LUTS nocturia Start Saw palmetto OTC supplement Will check PSA in 6 months, considered today last results have been stable   Meds ordered this  encounter  Medications   buPROPion (WELLBUTRIN XL) 150 MG 24 hr tablet    Sig: Take 1 tablet (150 mg total) by mouth daily.    Dispense:  90 tablet    Refill:  3   pseudoephedrine (SUDAFED) 30 MG tablet    Sig: Take 1 tablet (30 mg total) by mouth daily as needed for congestion.    Dispense:  30 tablet    Refill:  2     Follow up plan: Return in about 6 months (around 09/05/2021) for 6 month fasting lab only then 1 week later Annual Physical.  Future labs ordered for 6 months for blood panel including PSA  Nobie Putnam, DO [facility] 03/08/2021, 8:25 AM

## 2021-03-08 NOTE — Assessment & Plan Note (Signed)
Controlled w/ lifestyle changes improvements On Wellbutrin with improvement

## 2021-03-08 NOTE — Assessment & Plan Note (Signed)
Stable to improved On Wellbutrin

## 2021-03-18 DIAGNOSIS — J310 Chronic rhinitis: Secondary | ICD-10-CM | POA: Diagnosis not present

## 2021-03-18 DIAGNOSIS — J449 Chronic obstructive pulmonary disease, unspecified: Secondary | ICD-10-CM | POA: Diagnosis not present

## 2021-03-18 DIAGNOSIS — R0609 Other forms of dyspnea: Secondary | ICD-10-CM | POA: Diagnosis not present

## 2021-05-25 DIAGNOSIS — I739 Peripheral vascular disease, unspecified: Secondary | ICD-10-CM | POA: Diagnosis not present

## 2021-05-25 DIAGNOSIS — Z79899 Other long term (current) drug therapy: Secondary | ICD-10-CM | POA: Diagnosis not present

## 2021-05-25 DIAGNOSIS — Z7722 Contact with and (suspected) exposure to environmental tobacco smoke (acute) (chronic): Secondary | ICD-10-CM | POA: Diagnosis not present

## 2021-05-25 DIAGNOSIS — J302 Other seasonal allergic rhinitis: Secondary | ICD-10-CM | POA: Diagnosis not present

## 2021-05-25 DIAGNOSIS — M1990 Unspecified osteoarthritis, unspecified site: Secondary | ICD-10-CM | POA: Diagnosis not present

## 2021-05-25 DIAGNOSIS — Z803 Family history of malignant neoplasm of breast: Secondary | ICD-10-CM | POA: Diagnosis not present

## 2021-05-25 DIAGNOSIS — Z8249 Family history of ischemic heart disease and other diseases of the circulatory system: Secondary | ICD-10-CM | POA: Diagnosis not present

## 2021-05-25 DIAGNOSIS — E785 Hyperlipidemia, unspecified: Secondary | ICD-10-CM | POA: Diagnosis not present

## 2021-05-25 DIAGNOSIS — R69 Illness, unspecified: Secondary | ICD-10-CM | POA: Diagnosis not present

## 2021-05-25 DIAGNOSIS — N529 Male erectile dysfunction, unspecified: Secondary | ICD-10-CM | POA: Diagnosis not present

## 2021-05-25 DIAGNOSIS — R030 Elevated blood-pressure reading, without diagnosis of hypertension: Secondary | ICD-10-CM | POA: Diagnosis not present

## 2021-05-25 DIAGNOSIS — J439 Emphysema, unspecified: Secondary | ICD-10-CM | POA: Diagnosis not present

## 2021-06-04 ENCOUNTER — Ambulatory Visit: Payer: Medicare HMO

## 2021-06-11 ENCOUNTER — Other Ambulatory Visit: Payer: Self-pay

## 2021-06-11 ENCOUNTER — Ambulatory Visit: Payer: Medicare HMO

## 2021-06-11 DIAGNOSIS — Z0283 Encounter for blood-alcohol and blood-drug test: Secondary | ICD-10-CM

## 2021-06-11 NOTE — Progress Notes (Signed)
Presents to [facility] for Random DOT Drug Screen & Breath Alcohol Screen. ? ?[facility] Acct #:  [id number] ?[facility] Specimen #:  [id number] ? ?Breath Alcohol Screen Results = .000 ? ? ?AMD ?

## 2021-08-13 ENCOUNTER — Other Ambulatory Visit: Payer: Self-pay

## 2021-08-13 ENCOUNTER — Ambulatory Visit: Payer: Medicare HMO

## 2021-08-13 DIAGNOSIS — Z87891 Personal history of nicotine dependence: Secondary | ICD-10-CM

## 2021-08-13 DIAGNOSIS — Z122 Encounter for screening for malignant neoplasm of respiratory organs: Secondary | ICD-10-CM

## 2021-08-16 ENCOUNTER — Ambulatory Visit: Payer: Medicare HMO

## 2021-08-22 ENCOUNTER — Other Ambulatory Visit: Payer: Self-pay | Admitting: Family Medicine

## 2021-08-22 DIAGNOSIS — J302 Other seasonal allergic rhinitis: Secondary | ICD-10-CM

## 2021-08-22 NOTE — Telephone Encounter (Signed)
Requested Prescriptions  Pending Prescriptions Disp Refills  . 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 Olin Hauser, DO   2 years ago Centrilobular emphysema [facility])   [facility] Olin Hauser, DO      Future Appointments            In 2 weeks Parks Ranger, Devonne Doughty, DO [facility], [facility]

## 2021-08-28 ENCOUNTER — Other Ambulatory Visit: Payer: Self-pay | Admitting: *Deleted

## 2021-08-28 DIAGNOSIS — Z0000 Encounter for general adult medical examination without abnormal findings: Secondary | ICD-10-CM

## 2021-08-28 DIAGNOSIS — R972 Elevated prostate specific antigen [PSA]: Secondary | ICD-10-CM

## 2021-08-28 DIAGNOSIS — E785 Hyperlipidemia, unspecified: Secondary | ICD-10-CM

## 2021-08-28 DIAGNOSIS — J432 Centrilobular emphysema: Secondary | ICD-10-CM

## 2021-08-28 DIAGNOSIS — I700 Atherosclerosis of aorta: Secondary | ICD-10-CM

## 2021-08-28 DIAGNOSIS — F411 Generalized anxiety disorder: Secondary | ICD-10-CM

## 2021-08-28 DIAGNOSIS — M069 Rheumatoid arthritis, unspecified: Secondary | ICD-10-CM

## 2021-08-28 DIAGNOSIS — R7309 Other abnormal glucose: Secondary | ICD-10-CM

## 2021-08-29 ENCOUNTER — Other Ambulatory Visit: Payer: Medicare HMO

## 2021-08-29 DIAGNOSIS — M069 Rheumatoid arthritis, unspecified: Secondary | ICD-10-CM | POA: Diagnosis not present

## 2021-08-29 DIAGNOSIS — F411 Generalized anxiety disorder: Secondary | ICD-10-CM | POA: Diagnosis not present

## 2021-08-29 DIAGNOSIS — R69 Illness, unspecified: Secondary | ICD-10-CM | POA: Diagnosis not present

## 2021-08-29 DIAGNOSIS — J432 Centrilobular emphysema: Secondary | ICD-10-CM | POA: Diagnosis not present

## 2021-08-29 DIAGNOSIS — R7309 Other abnormal glucose: Secondary | ICD-10-CM | POA: Diagnosis not present

## 2021-08-29 DIAGNOSIS — E785 Hyperlipidemia, unspecified: Secondary | ICD-10-CM | POA: Diagnosis not present

## 2021-08-29 DIAGNOSIS — R972 Elevated prostate specific antigen [PSA]: Secondary | ICD-10-CM | POA: Diagnosis not present

## 2021-08-29 DIAGNOSIS — I700 Atherosclerosis of aorta: Secondary | ICD-10-CM | POA: Diagnosis not present

## 2021-08-29 DIAGNOSIS — Z0000 Encounter for general adult medical examination without abnormal findings: Secondary | ICD-10-CM | POA: Diagnosis not present

## 2021-08-30 LAB — COMPLETE METABOLIC PANEL WITH GFR
AG Ratio: 2.8 (calc) — ABNORMAL HIGH (ref 1.0–2.5)
ALT: 17 U/L (ref 9–46)
AST: 20 U/L (ref 10–35)
Albumin: 4.7 g/dL (ref 3.6–5.1)
Alkaline phosphatase (APISO): 59 U/L (ref 35–144)
BUN/Creatinine Ratio: 12 (calc) (ref 6–22)
BUN: 16 mg/dL (ref 7–25)
CO2: 30 mmol/L (ref 20–32)
Calcium: 10.1 mg/dL (ref 8.6–10.3)
Chloride: 103 mmol/L (ref 98–110)
Creat: 1.31 mg/dL — ABNORMAL HIGH (ref 0.70–1.28)
Globulin: 1.7 g/dL (calc) — ABNORMAL LOW (ref 1.9–3.7)
Glucose, Bld: 95 mg/dL (ref 65–99)
Potassium: 4.6 mmol/L (ref 3.5–5.3)
Sodium: 140 mmol/L (ref 135–146)
Total Bilirubin: 0.7 mg/dL (ref 0.2–1.2)
Total Protein: 6.4 g/dL (ref 6.1–8.1)
eGFR: 57 mL/min/{1.73_m2} — ABNORMAL LOW (ref >=60)

## 2021-08-30 LAB — CBC WITH DIFFERENTIAL/PLATELET
Absolute Monocytes: 612 cells/uL (ref 200–950)
Basophils Absolute: 61 cells/uL (ref 0–200)
Basophils Relative: 0.9 %
Eosinophils Absolute: 129 cells/uL (ref 15–500)
Eosinophils Relative: 1.9 %
HCT: 49.2 % (ref 38.5–50.0)
Hemoglobin: 16.2 g/dL (ref 13.2–17.1)
Lymphs Abs: 1476 cells/uL (ref 850–3900)
MCH: 30.6 pg (ref 27.0–33.0)
MCHC: 32.9 g/dL (ref 32.0–36.0)
MCV: 93 fL (ref 80.0–100.0)
MPV: 11.9 fL (ref 7.5–12.5)
Monocytes Relative: 9 %
Neutro Abs: 4522 cells/uL (ref 1500–7800)
Neutrophils Relative %: 66.5 %
Platelets: 234 10*3/uL (ref 140–400)
RBC: 5.29 10*6/uL (ref 4.20–5.80)
RDW: 12.5 % (ref 11.0–15.0)
Total Lymphocyte: 21.7 %
WBC: 6.8 10*3/uL (ref 3.8–10.8)

## 2021-08-30 LAB — HEMOGLOBIN A1C
Hgb A1c MFr Bld: 5.2 % of total Hgb (ref <5.7)
Mean Plasma Glucose: 103 mg/dL
eAG (mmol/L): 5.7 mmol/L

## 2021-08-30 LAB — TSH: TSH: 2.83 mIU/L (ref 0.40–4.50)

## 2021-08-30 LAB — LIPID PANEL
Cholesterol: 190 mg/dL (ref <200)
HDL: 57 mg/dL (ref >=40)
LDL Cholesterol (Calc): 107 mg/dL (calc) — ABNORMAL HIGH
Non-HDL Cholesterol (Calc): 133 mg/dL (calc) — ABNORMAL HIGH (ref <130)
Total CHOL/HDL Ratio: 3.3 (calc) (ref <5.0)
Triglycerides: 150 mg/dL — ABNORMAL HIGH (ref <150)

## 2021-08-30 LAB — PSA: PSA: 4.65 ng/mL — ABNORMAL HIGH (ref <=4.00)

## 2021-09-02 ENCOUNTER — Ambulatory Visit
Admission: RE | Admit: 2021-09-02 | Discharge: 2021-09-02 | Disposition: A | Discharge status: Home / Self Care | Payer: Medicare HMO | Source: Ambulatory Visit | Attending: Acute Care | Admitting: Acute Care

## 2021-09-02 DIAGNOSIS — Z122 Encounter for screening for malignant neoplasm of respiratory organs: Secondary | ICD-10-CM | POA: Diagnosis not present

## 2021-09-02 DIAGNOSIS — Z87891 Personal history of nicotine dependence: Secondary | ICD-10-CM | POA: Insufficient documentation

## 2021-09-04 ENCOUNTER — Telehealth: Payer: Self-pay | Admitting: Acute Care

## 2021-09-04 DIAGNOSIS — Z87891 Personal history of nicotine dependence: Secondary | ICD-10-CM

## 2021-09-04 DIAGNOSIS — Z122 Encounter for screening for malignant neoplasm of respiratory organs: Secondary | ICD-10-CM

## 2021-09-04 NOTE — Telephone Encounter (Signed)
Order placed for annual LDCT 

## 2021-09-04 NOTE — Telephone Encounter (Signed)
I have called the patient with the results of his low dose CT chest. I explained that from a Lung cancer perspective his scan was normal and he needed his annual lung cancer screening in 12 months.   There was an incidental finding as noted below>>  Please see incidental finding of asymmetric increased sclerosis of the anterior and lateral aspect of the right  6th rib. Please see recommendations per radiology for nuclear med bone scan and correlation with PSA values.  Please do not hesitate to communicate with me if you have any questions or concerns. From Lung Cancer screening point of view, LR 2, 12 month annual scan. Thanks so much  Pt has an appointment with his PCP,  Dr. Parks Ranger , tomorrow morning 7/13 at 8:45 am. He will discuss this result with his PCP  then, and determine next steps. . I have faxed the results of the scan with the above noted recommendations to his PCP.   Denise, 12 month follow up low dose CT Chest . PCP is included on this telephone message.

## 2021-09-05 ENCOUNTER — Ambulatory Visit (INDEPENDENT_AMBULATORY_CARE_PROVIDER_SITE_OTHER): Payer: Medicare HMO | Admitting: Family Medicine

## 2021-09-05 ENCOUNTER — Other Ambulatory Visit: Payer: Self-pay | Admitting: Family Medicine

## 2021-09-05 ENCOUNTER — Encounter: Payer: Self-pay | Admitting: Family Medicine

## 2021-09-05 VITALS — BP 131/78 | HR 80 | Ht 67.0 in | Wt 145.6 lb

## 2021-09-05 DIAGNOSIS — M069 Rheumatoid arthritis, unspecified: Secondary | ICD-10-CM | POA: Diagnosis not present

## 2021-09-05 DIAGNOSIS — E785 Hyperlipidemia, unspecified: Secondary | ICD-10-CM

## 2021-09-05 DIAGNOSIS — R9389 Abnormal findings on diagnostic imaging of other specified body structures: Secondary | ICD-10-CM

## 2021-09-05 DIAGNOSIS — J432 Centrilobular emphysema: Secondary | ICD-10-CM | POA: Diagnosis not present

## 2021-09-05 DIAGNOSIS — Z0000 Encounter for general adult medical examination without abnormal findings: Secondary | ICD-10-CM | POA: Diagnosis not present

## 2021-09-05 DIAGNOSIS — R972 Elevated prostate specific antigen [PSA]: Secondary | ICD-10-CM

## 2021-09-05 DIAGNOSIS — Q782 Osteopetrosis: Secondary | ICD-10-CM

## 2021-09-05 DIAGNOSIS — I700 Atherosclerosis of aorta: Secondary | ICD-10-CM | POA: Diagnosis not present

## 2021-09-05 DIAGNOSIS — F411 Generalized anxiety disorder: Secondary | ICD-10-CM

## 2021-09-05 DIAGNOSIS — R69 Illness, unspecified: Secondary | ICD-10-CM | POA: Diagnosis not present

## 2021-09-05 MED ORDER — DICLOFENAC SODIUM 1 % EX GEL: 2.0000 g | Freq: Four times a day (QID) | CUTANEOUS | 11 refills | Status: DC | PRN

## 2021-09-05 MED ORDER — AZITHROMYCIN 250 MG PO TABS: ORAL_TABLET | ORAL | 0 refills | Status: DC

## 2021-09-05 MED ORDER — ROSUVASTATIN CALCIUM 10 MG PO TABS: 10.0000 mg | ORAL_TABLET | ORAL | 3 refills | Status: DC

## 2021-09-05 NOTE — Progress Notes (Signed)
Subjective:    Patient ID: Derek Mayo, male    DOB: 1946/02/12, 76 y.o.   MRN: [id number]  Rohnan Bartleson is a 76 y.o. male presenting on 09/05/2021 for Annual Exam   HPI  Here for Annual Physical and Lab Review  1st week of May Felt stomach flu, and was feeling dehydrated, diarrhea, then it went to upper respiratory. He felt fever and cough thick sputum. Difficulty with coughing spasms difficulty breathing, difficulty breathing. Eventually got albuterol nebulizer in and then wore oxygen supplement. Initially did course of higher dose albuterol  He was taking Saw Palmetto regularly, AM and PM. Some improvement. He said it was working but not significant improvement. He was able to sleep about 6 hours without waking up with nocturia. Worsened after stomach virus   HYPERLIPIDEMIA: - Reports no concerns. Last lipid panel 08/2021 mostly controlled on lipids LDL improved - Currently taking Rosuvastatin intermittent x 2 weekly  GAD Improved on Wellbutrin. History of seasonal affective concerns. Wife has had cancer history and this has been a stressor.   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 If he is working more aggressively with arm and wrist, he would use a heavier brace for wrist. Also wears forearm strap. - He has modified his instruments with clarinet and flute to do lighter weight so it does not interfere with him playing now, he is doing well. He has no resting tremor Still using topical diclofenac PRN and copper glove   Centrilobular Emphysema COPD Severe Stage IV  // Allergic Rhinitis Followed by [facility] Pulmonology Dr Raul Del, continued on current regimen with Incruse, Albuterol, Flovent, and Flonase, Nasal Saline. - Today he reports doing well. No recent flare - He uses Azelastine usually at night PRN.  He uses ventolin PRN. He will use Flovent and Flonase daily. He also has Atrovent decongestant spray PRN. He will consider Pseudophed PRN only if need decongestant LDCT Screen scan 06/2021   Atherosclerosis on CT 06/2021   PROSTATE SCREENING / Elevated PSA / Mild BPH  PSA elevated on last lab to 4.65 (prior 4.1 > 4.21 >) Increasing nocturia, incomplete emptying.  Improved on Saw Palmetto  Currently with mild BPH LUTS, see AUA score. reports KNOWN family history of prostate CA, younger brother dx with mild prostate cancer s/p treated.  Additionally LDCT imaging showed   asymmetric increased sclerosis involving the anterior and lateral aspect of the right 6th rib. Etiology indeterminate. They recommended correlate with PSA     09/05/2021    9:08 AM 03/08/2021    8:19 AM 08/07/2020    9:47 AM  Depression screen PHQ 2/9  Decreased Interest 0 0 0  Down, Depressed, Hopeless 0 0 2  PHQ - 2 Score 0 0 2  Altered sleeping 0 0 3  Tired, decreased energy 0 0 3  Change in appetite 0 0 1  Feeling bad or failure about yourself  0 0 1  Trouble concentrating 0 0 0  Moving slowly or fidgety/restless 0 0 0  Suicidal thoughts 0 0 0  PHQ-9 Score 0 0 10  Difficult doing work/chores Not difficult at all Not difficult at all Somewhat difficult    Past Medical History:  Diagnosis Date   Allergic rhinitis    Anemia    Chicken pox    COPD (chronic obstructive pulmonary disease) (HCC)  Depression    Measles    Mumps    Premature beats    Past Surgical History:  Procedure Laterality Date   CHEST SURGERY     COLONOSCOPY WITH PROPOFOL N/A 06/25/2015   Procedure: COLONOSCOPY WITH PROPOFOL;  Surgeon: Josefine Class, MD;  Location[facility] ENDOSCOPY;  Service: Endoscopy;  Laterality: N/A;   KNEE SURGERY Left    removed broken cartilage   TONSILECTOMY/ADENOIDECTOMY WITH MYRINGOTOMY     TONSILLECTOMY     Social History   Socioeconomic History   Marital status: Married    Spouse name: Not on  file   Number of children: Not on file   Years of education: Not on file   Highest education level: Not on file  Occupational History   Not on file  Tobacco Use   Smoking status: Former    Packs/day: 1.00    Years: 40.00    Total pack years: 40.00    Types: Cigars, Cigarettes    Quit date: 08/13/2012    Years since quitting: 9.0   Smokeless tobacco: Former  Scientific laboratory technician Use: Never used  Substance and Sexual Activity   Alcohol use: Not Currently    Alcohol/week: 7.0 standard drinks of alcohol    Types: 7 Cans of beer per week   Drug use: Not Currently   Sexual activity: Not on file  Other Topics Concern   Not on file  Social History Narrative   Not on file   Social Determinants of Health   Financial Resource Strain: Low Risk  (08/07/2020)   Overall Financial Resource Strain (CARDIA)    Difficulty of Paying Living Expenses: Not hard at all  Food Insecurity: No Food Insecurity (08/07/2020)   Hunger Vital Sign    Worried About [place] in the Last Year: Never true    [place] in the Last Year: Never true  Transportation Needs: No Transportation Needs (08/07/2020)   PRAPARE - Hydrologist (Medical): No    Lack of Transportation (Non-Medical): No  Physical Activity: Inactive (08/07/2020)   Exercise Vital Sign    Days of Exercise per Week: 0 days    Minutes of Exercise per Session: 0 min  Stress: Stress Concern Present (08/07/2020)   [place]    Feeling of Stress : To some extent  Social Connections: Not on file  Intimate Partner Violence: Not on file   Family History  Problem Relation Age of Onset   Heart disease Father    AAA (abdominal aortic aneurysm) Brother    Prostate cancer Brother    Current Outpatient Medications on File Prior to Visit  Medication Sig   albuterol (PROVENTIL) (2.5 MG/3ML) 0.083% nebulizer solution Inhale 0.083 mLs into the  lungs every other day.   albuterol (VENTOLIN HFA) 108 (90 Base) MCG/ACT inhaler INHALE 2 PUFFS 4 TIMES A DAY AS NEEDED   aspirin EC 81 MG tablet Take 81 mg by mouth daily.   Azelastine HCl 0.15 % SOLN Place 1 spray into both nostrils daily as needed.   buPROPion (WELLBUTRIN XL) 150 MG 24 hr tablet Take 1 tablet (150 mg total) by mouth daily.   fluticasone (FLONASE) 50 MCG/ACT nasal spray SPRAY 2 SPRAYS INTO EACH NOSTRIL EVERY DAY   fluticasone (FLOVENT HFA) 110 MCG/ACT inhaler INHALE 1 PUFF TWICE A DAY   ipratropium (ATROVENT) 0.03 % nasal spray USE 1 SPRAY INTO BOTH NOSTRILS  DAILY AS NEEDED.   levalbuterol (XOPENEX) 1.25 MG/3ML nebulizer solution SMARTSIG:3 Milliliter(s) Via Nebulizer Every 6 Hours PRN   naproxen (NAPROSYN) 250 MG tablet Take 250 mg by mouth daily as needed.   pseudoephedrine (SUDAFED) 30 MG tablet Take 1 tablet (30 mg total) by mouth daily as needed for congestion.   umeclidinium bromide (INCRUSE ELLIPTA) 62.5 MCG/INH AEPB INHALE 1 INHALATION INTO THE LUNGS ONCE DAILY.   No current facility-administered medications on file prior to visit.    Review of Systems  Constitutional:  Negative for activity change, appetite change, chills, diaphoresis, fatigue and fever.  HENT:  Negative for congestion and hearing loss.   Eyes:  Negative for visual disturbance.  Respiratory:  Negative for cough, chest tightness, shortness of breath and wheezing.   Cardiovascular:  Negative for chest pain, palpitations and leg swelling.  Gastrointestinal:  Negative for abdominal pain, constipation, diarrhea, nausea and vomiting.  Genitourinary:  Positive for frequency. Negative for dysuria and hematuria.       Nocturia  Musculoskeletal:  Positive for arthralgias. Negative for neck pain.  Skin:  Negative for rash.  Neurological:  Negative for dizziness, weakness, light-headedness, numbness and headaches.  Hematological:  Negative for adenopathy.  Psychiatric/Behavioral:  Negative for behavioral  problems, dysphoric mood and sleep disturbance.    Per HPI unless specifically indicated above      Objective:    BP 131/78   Pulse 80   Ht $R'5\' 7"'tK$  (1.702 m)   Wt 145 lb 9.6 oz (66 kg)   SpO2 97%   BMI 22.80 kg/m   Wt Readings from Last 3 Encounters:  09/05/21 145 lb 9.6 oz (66 kg)  03/08/21 150 lb 6.4 oz (68.2 kg)  11/20/20 143 lb (64.9 kg)    Physical Exam Vitals and nursing note reviewed.  Constitutional:      General: He is not in acute distress.    Appearance: He is well-developed. He is not diaphoretic.     Comments: Well-appearing, comfortable, cooperative  HENT:     Head: Normocephalic and atraumatic.  Eyes:     General:        Right eye: No discharge.        Left eye: No discharge.     Conjunctiva/sclera: Conjunctivae normal.     Pupils: Pupils are equal, round, and reactive to light.  Neck:     Thyroid: No thyromegaly.  Cardiovascular:     Rate and Rhythm: Normal rate and regular rhythm.     Pulses: Normal pulses.     Heart sounds: Normal heart sounds. No murmur heard. Pulmonary:     Effort: Pulmonary effort is normal. No respiratory distress.     Breath sounds: No wheezing or rales.     Comments: Diffuse mild reduced air movement,  Abdominal:     General: Bowel sounds are normal. There is no distension.     Palpations: Abdomen is soft. There is no mass.     Tenderness: There is no abdominal tenderness.  Musculoskeletal:        General: No tenderness. Normal range of motion.     Cervical back: Normal range of motion and neck supple.     Right lower leg: No edema.     Left lower leg: No edema.     Comments: Upper / Lower Extremities: - Normal muscle tone, strength bilateral upper extremities 5/5, lower extremities 5/5  Lymphadenopathy:     Cervical: No cervical adenopathy.  Skin:    General: Skin is warm  and dry.     Findings: No erythema or rash.  Neurological:     Mental Status: He is alert and oriented to person, place, and time.     Comments:  Distal sensation intact to light touch all extremities  Psychiatric:        Mood and Affect: Mood normal.        Behavior: Behavior normal.        Thought Content: Thought content normal.     Comments: Well groomed, good eye contact, normal speech and thoughts     I have personally reviewed the radiology report from 09/02/21 on LDCT.  CLINICAL DATA:  Lung cancer screening. Former smoker. Forty pack-year history.   EXAM: CT CHEST WITHOUT CONTRAST LOW-DOSE FOR LUNG CANCER SCREENING   TECHNIQUE: Multidetector CT imaging of the chest was performed following the standard protocol without IV contrast.   RADIATION DOSE REDUCTION: This exam was performed according to the departmental dose-optimization program which includes automated exposure control, adjustment of the mA and/or kV according to patient size and/or use of iterative reconstruction technique.   COMPARISON:  07/12/2020   FINDINGS: Cardiovascular: Heart size appears within normal limits. Aortic atherosclerosis. Coronary artery atherosclerotic calcifications.   Mediastinum/Nodes: No enlarged mediastinal, hilar, or axillary lymph nodes. Thyroid gland, trachea, and esophagus demonstrate no significant findings.   Lungs/Pleura: Moderate to severe centrilobular and paraseptal emphysema. Scarring identified within both lung bases. No airspace consolidation or pleural effusion. Single tiny persistent lung nodule is identified within the right upper lobe with a mean derived diameter 1.2 mm. No new lung nodules.   Upper Abdomen: No acute abnormality.   Musculoskeletal: There is new asymmetric increased sclerosis involving the anterior and lateral aspect of the right 6th rib, image 213/3.   IMPRESSION: 1. Lung-RADS 2s, benign appearance or behavior. Continue annual screening with low-dose chest CT without contrast in 12 months. 2. The S modifier above refers to a new pulmonary finding. Specifically, there is asymmetric  increased sclerosis involving the anterior and lateral aspect of the right 6th rib. Etiology indeterminate. Recommend further evaluation with nuclear medicine bone scan and correlation with PSA values. 3. Coronary artery calcifications. 4. Aortic Atherosclerosis (ICD10-I70.0) and Emphysema (ICD10-J43.9).     Electronically Signed   By: Kerby Moors M.D.   On: 09/03/2021 17:09  Results for orders placed or performed in visit on 08/28/21  TSH  Result Value Ref Range   TSH 2.83 0.40 - 4.50 mIU/L  PSA  Result Value Ref Range   PSA 4.65 (H) < OR = 4.00 ng/mL  Hemoglobin A1c  Result Value Ref Range   Hgb A1c MFr Bld 5.2 <5.7 % of total Hgb   Mean Plasma Glucose 103 mg/dL   eAG (mmol/L) 5.7 mmol/L  CBC with Differential/Platelet  Result Value Ref Range   WBC 6.8 3.8 - 10.8 Thousand/uL   RBC 5.29 4.20 - 5.80 Million/uL   Hemoglobin 16.2 13.2 - 17.1 g/dL   HCT 49.2 38.5 - 50.0 %   MCV 93.0 80.0 - 100.0 fL   MCH 30.6 27.0 - 33.0 pg   MCHC 32.9 32.0 - 36.0 g/dL   RDW 12.5 11.0 - 15.0 %   Platelets 234 140 - 400 Thousand/uL   MPV 11.9 7.5 - 12.5 fL   Neutro Abs 4,522 1,500 - 7,800 cells/uL   Lymphs Abs 1,476 850 - 3,900 cells/uL   Absolute Monocytes 612 200 - 950 cells/uL   Eosinophils Absolute 129 15 - 500 cells/uL   Basophils  Absolute 61 0 - 200 cells/uL   Neutrophils Relative % 66.5 %   Total Lymphocyte 21.7 %   Monocytes Relative 9.0 %   Eosinophils Relative 1.9 %   Basophils Relative 0.9 %  Lipid panel  Result Value Ref Range   Cholesterol 190 <200 mg/dL   HDL 57 > OR = 40 mg/dL   Triglycerides 150 (H) <150 mg/dL   LDL Cholesterol (Calc) 107 (H) mg/dL (calc)   Total CHOL/HDL Ratio 3.3 <5.0 (calc)   Non-HDL Cholesterol (Calc) 133 (H) <130 mg/dL (calc)  COMPLETE METABOLIC PANEL WITH GFR  Result Value Ref Range   Glucose, Bld 95 65 - 99 mg/dL   BUN 16 7 - 25 mg/dL   Creat 1.31 (H) 0.70 - 1.28 mg/dL   eGFR 57 (L) > OR = 60 mL/min/1.30m2   BUN/Creatinine Ratio 12 6 -  22 (calc)   Sodium 140 135 - 146 mmol/L   Potassium 4.6 3.5 - 5.3 mmol/L   Chloride 103 98 - 110 mmol/L   CO2 30 20 - 32 mmol/L   Calcium 10.1 8.6 - 10.3 mg/dL   Total Protein 6.4 6.1 - 8.1 g/dL   Albumin 4.7 3.6 - 5.1 g/dL   Globulin 1.7 (L) 1.9 - 3.7 g/dL (calc)   AG Ratio 2.8 (H) 1.0 - 2.5 (calc)   Total Bilirubin 0.7 0.2 - 1.2 mg/dL   Alkaline phosphatase (APISO) 59 35 - 144 U/L   AST 20 10 - 35 U/L   ALT 17 9 - 46 U/L      Assessment & Plan:   Problem List Items Addressed This Visit     Rheumatoid arthritis involving both hands (HCC)    Stable with occasional episodic flares w/ inc repetitive activity Continue infrequent NSAID for now - Aleve OTC intermittent only, he is aware of risks on these meds Improved on Volatren Using R forearm brace/support / Wrist Widget, Forearm strap, Copperfit Gloves      Relevant Medications   diclofenac Sodium (VOLTAREN) 1 % GEL   GAD (generalized anxiety disorder)   Elevated PSA, less than 10 ng/ml   Relevant Orders   Ambulatory referral to Urology   Dyslipidemia    Controlled on intermittent statni Failed Atorvastatin 10mg  myalgias Calculated ASCVD 10 yr risk score 15-19%, former smoker  Plan: 1. Continue Rosuvastatin 10mg  TWICE weekly - highest tolerated dose, failed 3 times weekly 2. Continue ASA 81mg  for primary ASCVD risk reduction 3. Encourage improved lifestyle - low carb/cholesterol, reduce portion size, continue improving regular exercise 4. Follow-up yearly lipids      Relevant Medications   rosuvastatin (CRESTOR) 10 MG tablet   Centrilobular emphysema (HCC)    Stable without exacerbation Followed by [facility] Pulm Dr Donell Beers on Low Dose Lung CA Screen CT yearly last 06/2020 Former smoker, now second hand smoke  Chronic sinusitis / post nasal drainage often triggers some upper airway symptoms for him  Plan: 1. Follow-up with Dr Raul Del as planned - Continues on Incruse Ellipta, Flovent, Albuterol - Continue  sinus treatments - Flonase, Atrovent, Azelastine sprays      Relevant Medications   levalbuterol (XOPENEX) 1.25 MG/3ML nebulizer solution   Aortic atherosclerosis (HCC)    Stable without complication Identified on CT imaging 06/2021 Asymptomatic currently On intermittent statin therapy to control cholesterol reduce ASCVD risk      Relevant Medications   rosuvastatin (CRESTOR) 10 MG tablet   Other Visit Diagnoses     Annual physical exam    -  Primary   Bony sclerosis       Relevant Orders   Ambulatory referral to Urology   Abnormal CT scan       Relevant Orders   Ambulatory referral to Urology       Updated Health Maintenance information Reviewed recent lab results with patient Encouraged improvement to lifestyle with diet and exercise Goal maintain weight  Elevated PSA trend over 3+ years with 3.5 to 4.7 range, recent trend was 4.1 to 4.6 in past year, he has BPH LUTS symptoms now some worsening symptoms, recent Low Dose CT showed with asymmetric sclerosis of aspect of R 6th rib advised to correlate with future bone scan and consult with Urology work up for prostate cancer eval.   Orders Placed This Encounter  Procedures   Ambulatory referral to Urology    Referral Priority:   Routine    Referral Type:   Consultation    Referral Reason:   Specialty Services Required    Requested Specialty:   Urology    Number of Visits Requested:   1      Meds ordered this encounter  Medications   diclofenac Sodium (VOLTAREN) 1 % GEL    Sig: Apply 2 g topically 4 (four) times daily as needed (arthritis pain).    Dispense:  100 g    Refill:  11   rosuvastatin (CRESTOR) 10 MG tablet    Sig: Take 1 tablet (10 mg total) by mouth 2 (two) times a week.    Dispense:  24 tablet    Refill:  3      Follow up plan: Return in about 3 months (around 12/06/2021) for 3 month lab only then 1 week later Follow-up Kidney/COPD/Urology update.  Nobie Putnam, [facility] 09/05/2021, 9:18 AM

## 2021-09-05 NOTE — Patient Instructions (Addendum)
Thank you for coming to the office today.  Referral to Urology for consult with elevated PSA up to 4.6, and CT scan showing the abnormal rib.  [employer] [facility] [facility] -1st floor [place],  [place]  [address] Phone: [phone]  -------------------------------  All labs look excellent, except above PSA  Improved LDL cholesterol  Refilled medication for cholesterol.  Zpak back up if needed.  Mild elevated Creatinine - this is related to Kidney function decline. Goal is to improve hydration with more water. We can repeat this in 3 months stay well hydrated before the blood  DUE for NON FASTING BLOOD WORK - you can eat and drink  SCHEDULE "Lab Only" visit in the morning at the clinic for lab draw in 3 MONTHS   - Make sure Lab Only appointment is at about 1 week before your next appointment, so that results will be available  For Lab Results, once available within 2-3 days of blood draw, you can can log in to MyChart online to view your results and a brief explanation. Also, we can discuss results at next follow-up visit.   Please schedule a Follow-up Appointment to: Return in about 3 months (around 12/06/2021) for 3 month lab only then 1 week later Follow-up Kidney/COPD/Urology update.  If you have any other questions or concerns, please feel free to call the office or send a message through [place]. You may also schedule an earlier appointment if necessary.  Additionally, you may be receiving a survey about your experience at our office within a few days to 1 week by e-mail or mail. We value your feedback.  Nobie Putnam, DO [place]

## 2021-09-06 NOTE — Assessment & Plan Note (Signed)
Stable without complication Identified on CT imaging 06/2021 Asymptomatic currently On intermittent statin therapy to control cholesterol reduce ASCVD risk

## 2021-09-06 NOTE — Assessment & Plan Note (Signed)
Controlled on intermittent statni Failed Atorvastatin '10mg'$  myalgias Calculated ASCVD 10 yr risk score 15-19%, former smoker  Plan: 1. Continue Rosuvastatin '10mg'$  TWICE weekly - highest tolerated dose, failed 3 times weekly 2. Continue ASA '81mg'$  for primary ASCVD risk reduction 3. Encourage improved lifestyle - low carb/cholesterol, reduce portion size, continue improving regular exercise 4. Follow-up yearly lipids

## 2021-09-06 NOTE — Assessment & Plan Note (Signed)
Stable without exacerbation Followed by [facility] Pulm Dr Donell Beers on Low Dose Lung CA Screen CT yearly last 06/2020 Former smoker, now second hand smoke  Chronic sinusitis / post nasal drainage often triggers some upper airway symptoms for him  Plan: 1. Follow-up with Dr Raul Del as planned - Continues on Incruse Ellipta, Flovent, Albuterol - Continue sinus treatments - Flonase, Atrovent, Azelastine sprays

## 2021-09-06 NOTE — Assessment & Plan Note (Signed)
Stable with occasional episodic flares w/ inc repetitive activity Continue infrequent NSAID for now - Aleve OTC intermittent only, he is aware of risks on these meds Improved on Volatren Using R forearm brace/support / Wrist Widget, Forearm strap, Copperfit Gloves

## 2021-09-16 ENCOUNTER — Encounter: Payer: Self-pay | Admitting: *Deleted

## 2021-09-17 DIAGNOSIS — M899 Disorder of bone, unspecified: Secondary | ICD-10-CM | POA: Diagnosis not present

## 2021-09-17 DIAGNOSIS — Z87898 Personal history of other specified conditions: Secondary | ICD-10-CM | POA: Diagnosis not present

## 2021-09-17 DIAGNOSIS — R0609 Other forms of dyspnea: Secondary | ICD-10-CM | POA: Diagnosis not present

## 2021-09-17 DIAGNOSIS — J449 Chronic obstructive pulmonary disease, unspecified: Secondary | ICD-10-CM | POA: Diagnosis not present

## 2021-09-17 DIAGNOSIS — J301 Allergic rhinitis due to pollen: Secondary | ICD-10-CM | POA: Diagnosis not present

## 2021-09-18 ENCOUNTER — Ambulatory Visit (INDEPENDENT_AMBULATORY_CARE_PROVIDER_SITE_OTHER): Payer: Medicare HMO | Admitting: Urology

## 2021-09-18 ENCOUNTER — Other Ambulatory Visit
Admission: RE | Admit: 2021-09-18 | Discharge: 2021-09-18 | Disposition: A | Discharge status: Home / Self Care | Payer: Medicare HMO | Source: Ambulatory Visit | Attending: Urology | Admitting: Urology

## 2021-09-18 ENCOUNTER — Encounter: Payer: Self-pay | Admitting: Urology

## 2021-09-18 VITALS — BP 149/92 | HR 91 | Ht 67.0 in | Wt 145.0 lb

## 2021-09-18 DIAGNOSIS — R3912 Poor urinary stream: Secondary | ICD-10-CM | POA: Diagnosis not present

## 2021-09-18 DIAGNOSIS — N401 Enlarged prostate with lower urinary tract symptoms: Secondary | ICD-10-CM

## 2021-09-18 DIAGNOSIS — N138 Other obstructive and reflux uropathy: Secondary | ICD-10-CM | POA: Diagnosis not present

## 2021-09-18 DIAGNOSIS — R972 Elevated prostate specific antigen [PSA]: Secondary | ICD-10-CM | POA: Insufficient documentation

## 2021-09-18 DIAGNOSIS — R399 Unspecified symptoms and signs involving the genitourinary system: Secondary | ICD-10-CM

## 2021-09-18 DIAGNOSIS — Z125 Encounter for screening for malignant neoplasm of prostate: Secondary | ICD-10-CM | POA: Diagnosis not present

## 2021-09-18 LAB — URINALYSIS, COMPLETE (UACMP) WITH MICROSCOPIC
Bilirubin Urine: NEGATIVE
Glucose, UA: 100 mg/dL — AB
Hgb urine dipstick: NEGATIVE
Ketones, ur: NEGATIVE mg/dL
Leukocytes,Ua: NEGATIVE
Nitrite: NEGATIVE
Protein, ur: NEGATIVE mg/dL
Specific Gravity, Urine: 1.015 (ref 1.005–1.030)
pH: 6.5 (ref 5.0–8.0)

## 2021-09-18 LAB — BLADDER SCAN AMB NON-IMAGING

## 2021-09-18 MED ORDER — TAMSULOSIN HCL 0.4 MG PO CAPS: 0.4000 mg | ORAL_CAPSULE | Freq: Every day | ORAL | 11 refills | Status: DC

## 2021-09-18 NOTE — Patient Instructions (Signed)
Prostate Cancer Screening  Prostate cancer screening is testing that is done to check for the presence of prostate cancer in men. The prostate gland is a walnut-sized gland that is located below the bladder and in front of the rectum in males. The function of the prostate is to add fluid to semen during ejaculation. Prostate cancer is one of the most common types of cancer in men. Who should have prostate cancer screening? Screening recommendations vary based on age and other risk factors, as well as between the professional organizations who make the recommendations. In general, screening is recommended if: You are age 50 to 70 and have an average risk for prostate cancer. You should talk with your health care provider about your need for screening and how often screening should be done. Because most prostate cancers are slow growing and will not cause death, screening in this age group is generally reserved for men who have a 10- to 15-year life expectancy. You are younger than age 50, and you have these risk factors: Having a father, brother, or uncle who has been diagnosed with prostate cancer. The risk is higher if your family member's cancer occurred at an early age or if you have multiple family members with prostate cancer at an early age. Being a male who is Black or is of Caribbean or sub-Saharan African descent. In general, screening is not recommended if: You are younger than age 40. You are between the ages of 40 and 49 and you have no risk factors. You are 70 years of age or older. At this age, the risks that screening can cause are greater than the benefits that it may provide. If you are at high risk for prostate cancer, your health care provider may recommend that you have screenings more often or that you start screening at a younger age. How is screening for prostate cancer done? The recommended prostate cancer screening test is a blood test called the prostate-specific antigen  (PSA) test. PSA is a protein that is made in the prostate. As you age, your prostate naturally produces more PSA. Abnormally high PSA levels may be caused by: Prostate cancer. An enlarged prostate that is not caused by cancer (benign prostatic hyperplasia, or BPH). This condition is very common in older men. A prostate gland infection (prostatitis) or urinary tract infection. Certain medicines such as male hormones (like testosterone) or other medicines that raise testosterone levels. A rectal exam may be done as part of prostate cancer screening to help provide information about the size of your prostate gland. When a rectal exam is performed, it should be done after the PSA level is drawn to avoid any effect on the results. Depending on the PSA results, you may need more tests, such as: A physical exam to check the size of your prostate gland, if not done as part of screening. Blood and imaging tests. A procedure to remove tissue samples from your prostate gland for testing (biopsy). This is the only way to know for certain if you have prostate cancer. What are the benefits of prostate cancer screening? Screening can help to identify cancer at an early stage, before symptoms start and when the cancer can be treated more easily. There is a small chance that screening may lower your risk of dying from prostate cancer. The chance is small because prostate cancer is a slow-growing cancer, and most men with prostate cancer die from a different cause. What are the risks of prostate cancer screening? The   main risk of prostate cancer screening is diagnosing and treating prostate cancer that would never have caused any symptoms or problems. This is called overdiagnosisand overtreatment. PSA screening cannot tell you if your PSA is high due to cancer or a different cause. A prostate biopsy is the only procedure to diagnose prostate cancer. Even the results of a biopsy may not tell you if your cancer needs to  be treated. Slow-growing prostate cancer may not need any treatment other than monitoring, so diagnosing and treating it may cause unnecessary stress or other side effects. Questions to ask your health care provider When should I start prostate cancer screening? What is my risk for prostate cancer? How often do I need screening? What type of screening tests do I need? How do I get my test results? What do my results mean? Do I need treatment? Where to find more information The American Cancer Society: www.cancer.org American Urological Association: www.auanet.org Contact a health care provider if: You have difficulty urinating. You have pain when you urinate or ejaculate. You have blood in your urine or semen. You have pain in your back or in the area of your prostate. Summary Prostate cancer is a common type of cancer in men. The prostate gland is located below the bladder and in front of the rectum. This gland adds fluid to semen during ejaculation. Prostate cancer screening may identify cancer at an early stage, when the cancer can be treated more easily and is less likely to have spread to other areas of the body. The prostate-specific antigen (PSA) test is the recommended screening test for prostate cancer, but it has associated risks. Discuss the risks and benefits of prostate cancer screening with your health care provider. If you are age 70 or older, the risks that screening can cause are greater than the benefits that it may provide. This information is not intended to replace advice given to you by your health care provider. Make sure you discuss any questions you have with your health care provider. Document Revised: 08/06/2020 Document Reviewed: 08/06/2020 Elsevier Patient Education  2023 Elsevier Inc.  Benign Prostatic Hyperplasia  Benign prostatic hyperplasia (BPH) is an enlarged prostate gland that is caused by the normal aging process. The prostate may get bigger as a man  gets older. The condition is not caused by cancer. The prostate is a walnut-sized gland that is involved in the production of semen. It is located in front of the rectum and below the bladder. The bladder stores urine. The urethra carries stored urine out of the body. An enlarged prostate can press on the urethra. This can make it harder to pass urine. The buildup of urine in the bladder can cause infection. Back pressure and infection may progress to bladder damage and kidney (renal) failure. What are the causes? This condition is part of the normal aging process. However, not all men develop problems from this condition. If the prostate enlarges away from the urethra, urine flow will not be blocked. If it enlarges toward the urethra and compresses it, there will be problems passing urine. What increases the risk? This condition is more likely to develop in men older than 50 years. What are the signs or symptoms? Symptoms of this condition include: Getting up often during the night to urinate. Needing to urinate frequently during the day. Difficulty starting urine flow. Decrease in size and strength of your urine stream. Leaking (dribbling) after urinating. Inability to pass urine. This needs immediate treatment. Inability to completely   empty your bladder. Pain when you pass urine. This is more common if there is also an infection. Urinary tract infection (UTI). How is this diagnosed? This condition is diagnosed based on your medical history, a physical exam, and your symptoms. Tests will also be done, such as: A post-void bladder scan. This measures any amount of urine that may remain in your bladder after you finish urinating. A digital rectal exam. In a rectal exam, your health care provider checks your prostate by putting a lubricated, gloved finger into your rectum to feel the back of your prostate gland. This exam detects the size of your gland and any abnormal lumps or growths. An exam  of your urine (urinalysis). A prostate specific antigen (PSA) screening. This is a blood test used to screen for prostate cancer. An ultrasound. This test uses sound waves to electronically produce a picture of your prostate gland. Your health care provider may refer you to a specialist in kidney and prostate diseases (urologist). How is this treated? Once symptoms begin, your health care provider will monitor your condition (active surveillance or watchful waiting). Treatment for this condition will depend on the severity of your condition. Treatment may include: Observation and yearly exams. This may be the only treatment needed if your condition and symptoms are mild. Medicines to relieve your symptoms, including: Medicines to shrink the prostate. Medicines to relax the muscle of the prostate. Surgery in severe cases. Surgery may include: Prostatectomy. In this procedure, the prostate tissue is removed completely through an open incision or with a laparoscope or robotics. Transurethral resection of the prostate (TURP). In this procedure, a tool is inserted through the opening at the tip of the penis (urethra). It is used to cut away tissue of the inner core of the prostate. The pieces are removed through the same opening of the penis. This removes the blockage. Transurethral incision (TUIP). In this procedure, small cuts are made in the prostate. This lessens the prostate's pressure on the urethra. Transurethral microwave thermotherapy (TUMT). This procedure uses microwaves to create heat. The heat destroys and removes a small amount of prostate tissue. Transurethral needle ablation (TUNA). This procedure uses radio frequencies to destroy and remove a small amount of prostate tissue. Interstitial laser coagulation (ILC). This procedure uses a laser to destroy and remove a small amount of prostate tissue. Transurethral electrovaporization (TUVP). This procedure uses electrodes to destroy and  remove a small amount of prostate tissue. Prostatic urethral lift. This procedure inserts an implant to push the lobes of the prostate away from the urethra. Follow these instructions at home: Take over-the-counter and prescription medicines only as told by your health care provider. Monitor your symptoms for any changes. Contact your health care provider with any changes. Avoid drinking large amounts of liquid before going to bed or out in public. Avoid or reduce how much caffeine or alcohol you drink. Give yourself time when you urinate. Keep all follow-up visits. This is important. Contact a health care provider if: You have unexplained back pain. Your symptoms do not get better with treatment. You develop side effects from the medicine you are taking. Your urine becomes very dark or has a bad smell. Your lower abdomen becomes distended and you have trouble passing urine. Get help right away if: You have a fever or chills. You suddenly cannot urinate. You feel light-headed or very dizzy, or you faint. There are large amounts of blood or clots in your urine. Your urinary problems become hard to manage.   You develop moderate to severe low back or flank pain. The flank is the side of your body between the ribs and the hip. These symptoms may be an emergency. Get help right away. Call 911. Do not wait to see if the symptoms will go away. Do not drive yourself to the hospital. Summary Benign prostatic hyperplasia (BPH) is an enlarged prostate that is caused by the normal aging process. It is not caused by cancer. An enlarged prostate can press on the urethra. This can make it hard to pass urine. This condition is more likely to develop in men older than 50 years. Get help right away if you suddenly cannot urinate. This information is not intended to replace advice given to you by your health care provider. Make sure you discuss any questions you have with your health care  provider. Document Revised: 08/29/2020 Document Reviewed: 08/29/2020 Elsevier Patient Education  2023 Elsevier Inc.  

## 2021-09-18 NOTE — Progress Notes (Signed)
09/18/21 8:20 AM   Derek Mayo 24-Apr-1945 [id number]  CC: PSA screening, lower urinary tract symptoms  HPI: 76 year old male with very severe COPD who is referred for PSA of 4.51 and urinary symptoms of weak stream, straining, and sensation of incomplete emptying.  The urinary symptoms have been going on the last few months since he has been taking Sudafed regularly for an upper respiratory infection.  He denies any urinary symptoms prior to this, and was not having any nocturia.  He started saw palmetto as well in the past with no significant changes.  He denies any family history of prostate cancer.  PSA has essentially been stable over the last few years, 4.65 most recently in July 2023, 4.2 in July 2022, 4.1 in June 2021, and 4.7 in December 2019.  He denies any gross hematuria, dysuria, or UTIs.  Denies any problems with erections.   PMH: Past Medical History:  Diagnosis Date   Allergic rhinitis    Anemia    Chicken pox    COPD (chronic obstructive pulmonary disease) (HCC)    Depression    Measles    Mumps    Premature beats     Surgical History: Past Surgical History:  Procedure Laterality Date   CHEST SURGERY     COLONOSCOPY WITH PROPOFOL N/A 06/25/2015   Procedure: COLONOSCOPY WITH PROPOFOL;  Surgeon: Josefine Class, MD;  Location: [facility] ENDOSCOPY;  Service: Endoscopy;  Laterality: N/A;   KNEE SURGERY Left    removed broken cartilage   TONSILECTOMY/ADENOIDECTOMY WITH MYRINGOTOMY     TONSILLECTOMY       Family History: Family History  Problem Relation Age of Onset   Heart disease Father    AAA (abdominal aortic aneurysm) Brother    Prostate cancer Brother     Social History:  reports that he quit smoking about 9 years ago. His smoking use included cigars and cigarettes. He has a 40.00 pack-year smoking history. He has been exposed to tobacco smoke. He has quit using smokeless tobacco. He reports that he does not currently use alcohol after a past usage of  about 7.0 standard drinks of alcohol per week. He reports that he does not currently use drugs.  Physical Exam: BP (!) 149/92   Pulse 91   Ht '5\' 7"'$  (1.702 m)   Wt 145 lb (65.8 kg)   BMI 22.71 kg/m    Constitutional:  Alert and oriented, No acute distress. Cardiovascular: No clubbing, cyanosis, or edema. Respiratory: Normal respiratory effort, no increased work of breathing. GI: Abdomen is soft, nontender, nondistended, no abdominal masses GU: Circumcised phallus with patent meatus, no lesions, testicles 20 cc and descended bilaterally, large minimally tender right-sided inguinal hernia, chronic per patient DRE: 60 g, smooth, no nodules or masses  Laboratory Data: Reviewed, see HPI   Assessment & Plan:   76 year old male with severe COPD, stable PSA ~4-4.5 over the last 4 years, and urinary symptoms of weak stream and straining since starting Sudafed for an upper respiratory infection.  We discussed the association between Sudafed and upper respiratory medications and worsening urinary symptoms secondary to the effect on prostate tone.  I recommended a trial of Flomax 0.4 mg nightly and risk and benefits were discussed.  Regarding PSA, his PSA trend is very stable over the last 4 years and reassuring that this is more likely secondary to BPH and aging and not prostate cancer.  We reviewed the AUA guidelines that do not recommend routine screening in men over age  70, and that a normal value for men in their 69s is  less than 6.  I recommended discontinuing PSA screening in the setting of his stable PSA, normal PSA for his age, and comorbidities with severe COPD.  -Trial of Flomax for urinary symptoms, risks of Sudafed discussed at length regarding increased prostate tone -Recommend discontinuing PSA screening with his age and comorbidities, and stable PSA over the last 4 years -RTC 2 to 3 months symptom check on [place], MD 09/18/2021  [place] [address] [place], [place] [address] [phone]

## 2021-09-23 DIAGNOSIS — M810 Age-related osteoporosis without current pathological fracture: Secondary | ICD-10-CM | POA: Diagnosis not present

## 2021-09-23 LAB — HM DEXA SCAN

## 2021-09-25 ENCOUNTER — Encounter: Payer: Self-pay | Admitting: Family Medicine

## 2021-09-25 DIAGNOSIS — M810 Age-related osteoporosis without current pathological fracture: Secondary | ICD-10-CM

## 2021-11-20 ENCOUNTER — Ambulatory Visit (INDEPENDENT_AMBULATORY_CARE_PROVIDER_SITE_OTHER): Payer: Medicare HMO

## 2021-11-20 DIAGNOSIS — Z23 Encounter for immunization: Secondary | ICD-10-CM

## 2021-11-22 ENCOUNTER — Other Ambulatory Visit: Payer: Self-pay | Admitting: Family Medicine

## 2021-11-22 DIAGNOSIS — J302 Other seasonal allergic rhinitis: Secondary | ICD-10-CM

## 2021-11-22 NOTE — Telephone Encounter (Signed)
Requested Prescriptions  Pending Prescriptions Disp Refills  . 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

## 2021-12-02 ENCOUNTER — Other Ambulatory Visit: Payer: Self-pay

## 2021-12-02 DIAGNOSIS — R972 Elevated prostate specific antigen [PSA]: Secondary | ICD-10-CM

## 2021-12-02 DIAGNOSIS — E785 Hyperlipidemia, unspecified: Secondary | ICD-10-CM

## 2021-12-03 ENCOUNTER — Other Ambulatory Visit: Payer: Medicare HMO

## 2021-12-03 DIAGNOSIS — E785 Hyperlipidemia, unspecified: Secondary | ICD-10-CM | POA: Diagnosis not present

## 2021-12-03 DIAGNOSIS — R972 Elevated prostate specific antigen [PSA]: Secondary | ICD-10-CM | POA: Diagnosis not present

## 2021-12-04 LAB — COMPREHENSIVE METABOLIC PANEL
AG Ratio: 2.1 (calc) (ref 1.0–2.5)
ALT: 14 U/L (ref 9–46)
AST: 21 U/L (ref 10–35)
Albumin: 4.4 g/dL (ref 3.6–5.1)
Alkaline phosphatase (APISO): 62 U/L (ref 35–144)
BUN: 17 mg/dL (ref 7–25)
CO2: 30 mmol/L (ref 20–32)
Calcium: 9.7 mg/dL (ref 8.6–10.3)
Chloride: 103 mmol/L (ref 98–110)
Creat: 1.22 mg/dL (ref 0.70–1.28)
Globulin: 2.1 g/dL (calc) (ref 1.9–3.7)
Glucose, Bld: 88 mg/dL (ref 65–99)
Potassium: 4.4 mmol/L (ref 3.5–5.3)
Sodium: 140 mmol/L (ref 135–146)
Total Bilirubin: 0.5 mg/dL (ref 0.2–1.2)
Total Protein: 6.5 g/dL (ref 6.1–8.1)

## 2021-12-04 LAB — LIPID PANEL
Cholesterol: 193 mg/dL (ref <200)
HDL: 62 mg/dL (ref >=40)
LDL Cholesterol (Calc): 106 mg/dL (calc) — ABNORMAL HIGH
Non-HDL Cholesterol (Calc): 131 mg/dL (calc) — ABNORMAL HIGH (ref <130)
Total CHOL/HDL Ratio: 3.1 (calc) (ref <5.0)
Triglycerides: 140 mg/dL (ref <150)

## 2021-12-04 LAB — PSA: PSA: 4.64 ng/mL — ABNORMAL HIGH (ref <=4.00)

## 2021-12-10 ENCOUNTER — Other Ambulatory Visit: Payer: Self-pay | Admitting: Family Medicine

## 2021-12-10 ENCOUNTER — Encounter: Payer: Self-pay | Admitting: Family Medicine

## 2021-12-10 ENCOUNTER — Ambulatory Visit (INDEPENDENT_AMBULATORY_CARE_PROVIDER_SITE_OTHER): Payer: Medicare HMO | Admitting: Family Medicine

## 2021-12-10 VITALS — BP 130/80 | HR 96 | Ht 67.0 in | Wt 151.0 lb

## 2021-12-10 DIAGNOSIS — E785 Hyperlipidemia, unspecified: Secondary | ICD-10-CM

## 2021-12-10 DIAGNOSIS — I700 Atherosclerosis of aorta: Secondary | ICD-10-CM

## 2021-12-10 DIAGNOSIS — R972 Elevated prostate specific antigen [PSA]: Secondary | ICD-10-CM

## 2021-12-10 DIAGNOSIS — F411 Generalized anxiety disorder: Secondary | ICD-10-CM | POA: Diagnosis not present

## 2021-12-10 DIAGNOSIS — Z0000 Encounter for general adult medical examination without abnormal findings: Secondary | ICD-10-CM

## 2021-12-10 DIAGNOSIS — J432 Centrilobular emphysema: Secondary | ICD-10-CM | POA: Diagnosis not present

## 2021-12-10 DIAGNOSIS — R69 Illness, unspecified: Secondary | ICD-10-CM | POA: Diagnosis not present

## 2021-12-10 DIAGNOSIS — R7309 Other abnormal glucose: Secondary | ICD-10-CM

## 2021-12-10 DIAGNOSIS — M06041 Rheumatoid arthritis without rheumatoid factor, right hand: Secondary | ICD-10-CM

## 2021-12-10 DIAGNOSIS — M06042 Rheumatoid arthritis without rheumatoid factor, left hand: Secondary | ICD-10-CM | POA: Diagnosis not present

## 2021-12-10 MED ORDER — FLUTICASONE PROPIONATE HFA 110 MCG/ACT IN AERO: 1.0000 | INHALATION_SPRAY | Freq: Two times a day (BID) | RESPIRATORY_TRACT | 12 refills | Status: DC

## 2021-12-10 MED ORDER — BUPROPION HCL ER (XL) 150 MG PO TB24: 150.0000 mg | ORAL_TABLET | Freq: Every day | ORAL | 3 refills | Status: DC

## 2021-12-10 MED ORDER — AZITHROMYCIN 250 MG PO TABS: ORAL_TABLET | ORAL | 0 refills | Status: DC

## 2021-12-10 NOTE — Assessment & Plan Note (Signed)
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 Continue Rosuvastatin '10mg'$  TWICE weekly - highest tolerated dose, failed 3 times weekly 2. Continue ASA '81mg'$  for primary ASCVD risk reduction 3. Encourage improved lifestyle - low carb/cholesterol, reduce portion size, continue improving regular exercise 4. Follow-up yearly lipids

## 2021-12-10 NOTE — Assessment & Plan Note (Signed)
Stable mild elevated PSA Followed by [facility] now Reassurance given assessment and will follow with them Now improved BPH LUTS on Flomax as well as Saw Palmetto I advised he can HOLD the saw Palmetto if no longer needed

## 2021-12-10 NOTE — Assessment & Plan Note (Addendum)
Stable without exacerbation Followed by [facility] Pulm Dr Donell Beers on Low Dose Lung CA Screen CT yearly last 06/2020 Former smoker, now second hand smoke  Chronic sinusitis / post nasal drainage often triggers some upper airway symptoms for him  Plan: 1. Follow-up with Dr Raul Del as planned - Continues on Incruse Ellipta, Flovent, Albuterol - Continue sinus treatments - Flonase, Atrovent, Azelastine sprays  He continues to use supplemental oxygen very rarely PRN, not covered by insurance He has hand nebulizer device as well

## 2021-12-10 NOTE — Patient Instructions (Addendum)
Thank you for coming to the office today.  Keep up the great work overall.  Lipid Panel     Component Value Date/Time   CHOL 193 12/03/2021 0818   CHOL 170 11/01/2014 0857   TRIG 140 12/03/2021 0818   HDL 62 12/03/2021 0818   HDL 50 11/01/2014 0857   CHOLHDL 3.1 12/03/2021 0818   LDLCALC 106 (H) 12/03/2021 0818   LABVLDL 23 11/01/2014 0857      Latest Ref Rng & Units 12/03/2021    8:18 AM 08/29/2021    8:11 AM 08/30/2020    8:00 AM  CMP  Glucose 65 - 99 mg/dL 88  95  90   BUN 7 - 25 mg/dL '17  16  18   '$ Creatinine 0.70 - 1.28 mg/dL 1.22  1.31  1.04   Sodium 135 - 146 mmol/L 140  140  140   Potassium 3.5 - 5.3 mmol/L 4.4  4.6  4.4   Chloride 98 - 110 mmol/L 103  103  104   CO2 20 - 32 mmol/L '30  30  29   '$ Calcium 8.6 - 10.3 mg/dL 9.7  10.1  9.7   Total Protein 6.1 - 8.1 g/dL 6.5  6.4  6.5   Total Bilirubin 0.2 - 1.2 mg/dL 0.5  0.7  0.8   AST 10 - 35 U/L '21  20  19   '$ ALT 9 - 46 U/L '14  17  14      '$ DUE for FASTING BLOOD WORK (no food or drink after midnight before the lab appointment, only water or coffee without cream/sugar on the morning of)  SCHEDULE "Lab Only" visit in the morning at the clinic for lab draw in 6 MONTHS   - Make sure Lab Only appointment is at about 1 week before your next appointment, so that results will be available  For Lab Results, once available within 2-3 days of blood draw, you can can log in to MyChart online to view your results and a brief explanation. Also, we can discuss results at next follow-up visit.   Please schedule a Follow-up Appointment to: Return in about 6 months (around 06/11/2022) for 6 month fasting lab only then 1 week later Annual Physical.  If you have any other questions or concerns, please feel free to call the office or send a message through [place]. You may also schedule an earlier appointment if necessary.  Additionally, you may be receiving a survey about your experience at our office within a few days to 1 week by e-mail  or mail. We value your feedback.  Nobie Putnam, DO [place]

## 2021-12-10 NOTE — Progress Notes (Signed)
Subjective:    Patient ID: Derek Mayo, male    DOB: [birth year], 76 y.o.   MRN: [id number]  Derek Mayo is a 76 y.o. male presenting on 12/10/2021 for COPD, Hyperlipidemia, and Elevated PSA   HPI  Here for Annual Physical and Lab Review  Upcoming mountain trip, later this week, will bring mountain bike  HYPERLIPIDEMIA: - Reports no concerns. Last lipid panel 08/2021 and 11/2021 mostly controlled on lipids LDL improved - Currently taking Rosuvastatin intermittent x 2 weekly   GAD Improved on Wellbutrin. History of seasonal affective concerns. Wife has had cancer history and this has been a stressor.    PMH Right Wrist Pain / Benign Essential Tremors   Centrilobular Emphysema COPD Severe Stage IV  // Allergic Rhinitis Followed by [facility] Pulmonology Dr Raul Del, continued on current regimen with Incruse, Albuterol, Flovent, and Flonase, Nasal Saline. - Today he reports doing well. No recent flare - He uses Azelastine usually at night PRN. He uses ventolin PRN. He will use Flovent and Flonase daily. He also has Atrovent decongestant spray PRN. He will consider Pseudophed PRN only if need decongestant LDCT Screen scan 06/2021  Updates Now E-bike, now doing 6-8 miles per day, much improved with his exercise and helps his breathing. Cannot get albuterol sulfate solution has used portable nebulizer hand held  He wakes up Pulse ox 92-93 early AM BP is low, 120/70-80s, and it will raise after his takes all inhalers and nasal steroids, BP will raise later as a result. Also occasional PRN inhaler can raise his BP will raise up to 140s  he has used Zpak since last visit, any early onset viral or infectious symptoms, start with swollen lymph nodes, he cleared. He will need new order on Zpak Dr Raul Del switched him to Levalbuterol - He has hand-held nebulizer machine - He fills his oxygen bottle, he starts with 1 L per minute if feeling bad, then he will go down to 0.5 L per  minute, then reduce off and use rarely PRN over year, he is able to fill his own tank at airport.   Atherosclerosis on CT 06/2021   PROSTATE SCREENING / Elevated PSA / Mild BPH   PSA elevated on last lab to 4.64 and previous 4.65 unchanged  (prior 4.1 > 4.21 >) Improved urination overall now on therapy KNOWN family history of prostate CA, younger brother dx with mild prostate cancer s/p treated.  Established with [facility] Dr Diamantina Providence - On [facility] and continues Flomax regularity - He has ruled out prostate cancer - Now patient has significantly improved LUTS and no more nocturia - He prefers to come off [employer] if he can, and continue Flomax - Has upcoming apt follow up end of October again with Urology   Additionally LDCT imaging showed    asymmetric increased sclerosis involving the anterior and lateral aspect of the right 6th rib. Etiology indeterminate. They recommended correlate with PSA       09/05/2021    9:08 AM 03/08/2021    8:19 AM 08/07/2020    9:47 AM  Depression screen PHQ 2/9  Decreased Interest 0 0 0  Down, Depressed, Hopeless 0 0 2  PHQ - 2 Score 0 0 2  Altered sleeping 0 0 3  Tired, decreased energy 0 0 3  Change in appetite 0 0 1  Feeling bad or failure about yourself  0 0 1  Trouble concentrating 0 0 0  Moving slowly or fidgety/restless 0 0 0  Suicidal thoughts 0 0 0  PHQ-9 Score 0 0 10  Difficult doing work/chores Not difficult at all Not difficult at all Somewhat difficult    Social History   Tobacco Use   Smoking status: Former    Packs/day: 1.00    Years: 40.00    Total pack years: 40.00    Types: Cigars, Cigarettes    Quit date: 08/13/2012    Years since quitting: 9.3    Passive exposure: Past   Smokeless tobacco: Former  Scientific laboratory technician Use: Never used  Substance Use Topics   Alcohol use: Not Currently    Alcohol/week: 7.0 standard drinks of alcohol    Types: 7 Cans of beer per week   Drug use: Not Currently     Review of Systems Per HPI unless specifically indicated above     Objective:    BP 130/80 (BP Location: Left Arm, Cuff Size: Normal)   Pulse 96   Ht '5\' 7"'$  (1.702 m)   Wt 151 lb (68.5 kg)   SpO2 97%   BMI 23.65 kg/m   Wt Readings from Last 3 Encounters:  12/10/21 151 lb (68.5 kg)  09/18/21 145 lb (65.8 kg)  09/05/21 145 lb 9.6 oz (66 kg)    Physical Exam Vitals and nursing note reviewed.  Constitutional:      General: He is not in acute distress.    Appearance: He is well-developed. He is not diaphoretic.     Comments: Well-appearing, comfortable, cooperative  HENT:     Head: Normocephalic and atraumatic.  Eyes:     General:        Right eye: No discharge.        Left eye: No discharge.     Conjunctiva/sclera: Conjunctivae normal.     Pupils: Pupils are equal, round, and reactive to light.  Neck:     Thyroid: No thyromegaly.  Cardiovascular:     Rate and Rhythm: Normal rate and regular rhythm.     Pulses: Normal pulses.     Heart sounds: Normal heart sounds. No murmur heard. Pulmonary:     Effort: Pulmonary effort is normal. No respiratory distress.     Breath sounds: No wheezing or rales.     Comments: Diffuse mild reduced air movement,  Abdominal:     General: Bowel sounds are normal. There is no distension.     Palpations: Abdomen is soft. There is no mass.     Tenderness: There is no abdominal tenderness.  Musculoskeletal:        General: No tenderness. Normal range of motion.     Cervical back: Normal range of motion and neck supple.     Right lower leg: No edema.     Left lower leg: No edema.     Comments: Upper / Lower Extremities: - Normal muscle tone, strength bilateral upper extremities 5/5, lower extremities 5/5  Lymphadenopathy:     Cervical: No cervical adenopathy.  Skin:    General: Skin is warm and dry.     Findings: No erythema or rash.  Neurological:     Mental Status: He is alert and oriented to person, place, and time.     Comments:  Distal sensation intact to light touch all extremities  Psychiatric:        Mood and Affect: Mood normal.        Behavior: Behavior normal.        Thought Content: Thought content normal.     Comments:  Well groomed, good eye contact, normal speech and thoughts       Results for orders placed or performed in visit on 12/02/21  Lipid panel  Result Value Ref Range   Cholesterol 193 <200 mg/dL   HDL 62 > OR = 40 mg/dL   Triglycerides 140 <150 mg/dL   LDL Cholesterol (Calc) 106 (H) mg/dL (calc)   Total CHOL/HDL Ratio 3.1 <5.0 (calc)   Non-HDL Cholesterol (Calc) 131 (H) <130 mg/dL (calc)  PSA  Result Value Ref Range   PSA 4.64 (H) < OR = 4.00 ng/mL  Comprehensive Metabolic Panel (CMET)  Result Value Ref Range   Glucose, Bld 88 65 - 99 mg/dL   BUN 17 7 - 25 mg/dL   Creat 1.22 0.70 - 1.28 mg/dL   BUN/Creatinine Ratio SEE NOTE: 6 - 22 (calc)   Sodium 140 135 - 146 mmol/L   Potassium 4.4 3.5 - 5.3 mmol/L   Chloride 103 98 - 110 mmol/L   CO2 30 20 - 32 mmol/L   Calcium 9.7 8.6 - 10.3 mg/dL   Total Protein 6.5 6.1 - 8.1 g/dL   Albumin 4.4 3.6 - 5.1 g/dL   Globulin 2.1 1.9 - 3.7 g/dL (calc)   AG Ratio 2.1 1.0 - 2.5 (calc)   Total Bilirubin 0.5 0.2 - 1.2 mg/dL   Alkaline phosphatase (APISO) 62 35 - 144 U/L   AST 21 10 - 35 U/L   ALT 14 9 - 46 U/L      Assessment & Plan:   Problem List Items Addressed This Visit     Aortic atherosclerosis ([place])    Stable without complication Identified on CT imaging 06/2021 Asymptomatic currently On intermittent statin therapy to control cholesterol reduce ASCVD risk      Centrilobular emphysema (HCC) - Primary    Stable without exacerbation Followed by [facility] Pulm Dr Donell Beers on Low Dose Lung CA Screen CT yearly last 06/2020 Former smoker, now second hand smoke  Chronic sinusitis / post nasal drainage often triggers some upper airway symptoms for him  Plan: 1. Follow-up with Dr Raul Del as planned - Continues on Incruse Ellipta,  Flovent, Albuterol - Continue sinus treatments - Flonase, Atrovent, Azelastine sprays  He continues to use supplemental oxygen very rarely PRN, not covered by insurance He has hand nebulizer device as well      Relevant Medications   fluticasone (FLOVENT HFA) 110 MCG/ACT inhaler   Dyslipidemia    Controlled on intermittent statin Failed Atorvastatin '10mg'$  myalgias Calculated ASCVD 10 yr risk score 15-19%, former smoker  Plan: 1. Continue Rosuvastatin '10mg'$  TWICE weekly - highest tolerated dose, failed 3 times weekly 2. Continue ASA '81mg'$  for primary ASCVD risk reduction 3. Encourage improved lifestyle - low carb/cholesterol, reduce portion size, continue improving regular exercise 4. 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  Dispense:  90 tablet    Refill:  3   fluticasone (FLOVENT HFA) 110 MCG/ACT inhaler    Sig: Inhale 1 puff into the lungs 2 (two) times daily.    Dispense:  1 each    Refill:  12      Follow up plan: Return in about 6 months (around 06/11/2022) for 6 month fasting lab only then 1 week later Annual Physical.  Future labs ordered for 05/2022 Lipid, CBC A1c CMET PSA TSH   Nobie Putnam, DO [facility] 12/10/2021, 8:31 AM

## 2021-12-10 NOTE — Assessment & Plan Note (Signed)
Stable without complication Identified on CT imaging 06/2021 Asymptomatic currently On intermittent statin therapy to control cholesterol reduce ASCVD risk

## 2021-12-17 ENCOUNTER — Ambulatory Visit (INDEPENDENT_AMBULATORY_CARE_PROVIDER_SITE_OTHER): Payer: Medicare HMO | Admitting: Urology

## 2021-12-17 ENCOUNTER — Other Ambulatory Visit: Payer: Self-pay | Admitting: *Deleted

## 2021-12-17 ENCOUNTER — Encounter: Payer: Self-pay | Admitting: Urology

## 2021-12-17 VITALS — BP 148/90 | HR 80 | Ht 67.0 in | Wt 150.0 lb

## 2021-12-17 DIAGNOSIS — N138 Other obstructive and reflux uropathy: Secondary | ICD-10-CM

## 2021-12-17 DIAGNOSIS — Z125 Encounter for screening for malignant neoplasm of prostate: Secondary | ICD-10-CM | POA: Diagnosis not present

## 2021-12-17 DIAGNOSIS — N401 Enlarged prostate with lower urinary tract symptoms: Secondary | ICD-10-CM | POA: Diagnosis not present

## 2021-12-17 LAB — BLADDER SCAN AMB NON-IMAGING

## 2021-12-17 MED ORDER — TAMSULOSIN HCL 0.4 MG PO CAPS: 0.4000 mg | ORAL_CAPSULE | Freq: Every day | ORAL | 3 refills | Status: DC

## 2021-12-17 NOTE — Progress Notes (Signed)
   12/17/2021 8:33 AM   Derek Mayo 11-24-1945 [id number]  Reason for visit: Follow up PSA screening, lower urinary tract symptoms/BPH  HPI: 76 year old male with severe COPD who I saw in July 2023 for a PSA of 4.5 that had been stable over the last few years, as well as symptoms of weak stream, straining, and sensation of incomplete emptying.  He was taking Sudafed regularly for an upper respiratory infection which was likely contributed, and he opted for a trial of Flomax.  Regarding the PSA, this was rechecked again by PCP in October and remained stable at 4.64 from 4.65 3 months ago.  We reviewed the AUA guidelines at length previously that do not recommend routine screening in men over age 46, especially those with other significant comorbidities, and that his overall PSA trend is reassuring.  Since starting Flomax his urinary symptoms have improved significantly.  He really denies any urinary complaints today, and is sleeping through the night.  PVR today is normal at 0 mL.  -No further PSA screening per guideline recommendations -Flomax changed to 90-day supply, continue 0.4 mg daily, okay to discontinue saw palmetto -RTC 1 year PVR  [place], [place] [address] [place], [place] [address] [phone]

## 2022-02-12 ENCOUNTER — Other Ambulatory Visit: Payer: Self-pay | Admitting: Family Medicine

## 2022-02-12 DIAGNOSIS — J302 Other seasonal allergic rhinitis: Secondary | ICD-10-CM

## 2022-02-13 NOTE — Telephone Encounter (Signed)
Requested Prescriptions  Pending Prescriptions Disp Refills   fluticasone (FLONASE) 50 MCG/ACT nasal spray [Pharmacy Med Name: FLUTICASONE PROP 50 MCG SPRAY] 48 mL 0    Sig: SPRAY 2 SPRAYS INTO EACH NOSTRIL EVERY DAY     Ear, Nose, and Throat: Nasal Preparations - Corticosteroids Passed - 02/12/2022 10:43 AM      Passed - Valid encounter within last 12 months    Recent Outpatient Visits           2 months ago Centrilobular emphysema ([place])   [place], DO   5 months ago Annual physical exam   [place], DO   11 months ago GAD (generalized anxiety disorder)   [place], DO   1 year ago Annual physical exam   [place], DO   2 years ago 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 Parks Ranger, Devonne Doughty, DO       Future Appointments             In 3 months Parks Ranger, Devonne Doughty, DO [facility] [place]   In 10 months Diamantina Providence, Herbert Seta, MD [facility] [place]

## 2022-02-21 ENCOUNTER — Telehealth: Payer: Self-pay | Admitting: Family Medicine

## 2022-02-21 ENCOUNTER — Other Ambulatory Visit: Payer: Self-pay | Admitting: Family Medicine

## 2022-02-21 DIAGNOSIS — J432 Centrilobular emphysema: Secondary | ICD-10-CM

## 2022-02-21 NOTE — Telephone Encounter (Signed)
Patient's Flovent inhaler is no longer covered, he will need alternative inhaler.  I did not receive a list from his pharmacy for alternatives.  He may need to check with insurance or pharmacy to get [place] this list so we can choose a covered version.  He may also discuss this with Dr Raul Del - Pulmonology and review his inhalers for an alternative version.  Derek Mayo, [facility] 02/21/2022, 3:12 PM

## 2022-02-23 IMAGING — CT CT CHEST LUNG CANCER SCREENING LOW DOSE W/O CM
2 of 5 series · 14 of 40 positions shown, 17 images · non-contrast
Comparison: Low-dose lung cancer screening chest CT 06/30/2019.

CLINICAL DATA: 74-year-old male former smoker (quit 8 years ago)
with 40 pack-year history of smoking. Lung cancer screening
examination.

EXAM:
CT CHEST WITHOUT CONTRAST LOW-DOSE FOR LUNG CANCER SCREENING
TECHNIQUE: Multidetector CT imaging of the chest was performed following the
standard protocol without IV contrast.

[Series 3: lung 1.00 · axial · 0.68mm/px · z∈[-1280,-954]mm · 11 of 364 slices shown, 14 images]
[im 19/364  mediastinal]
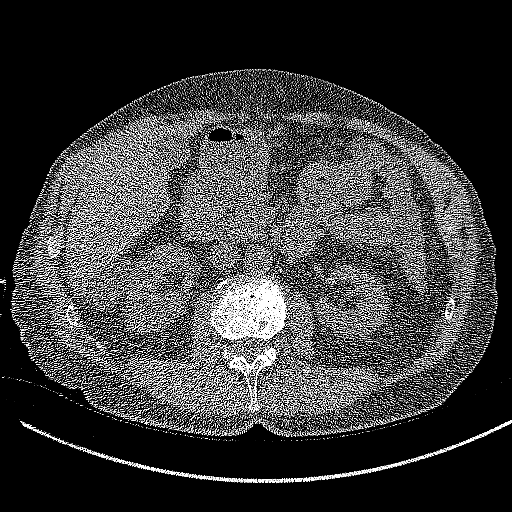
[im 19/364  lung]
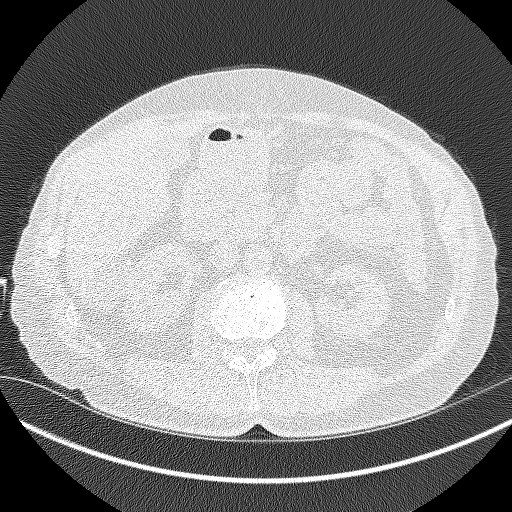
[im 55/364  lung]
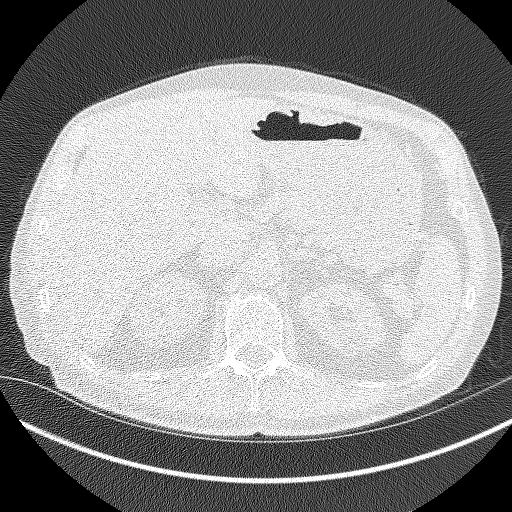
[im 91/364  lung]
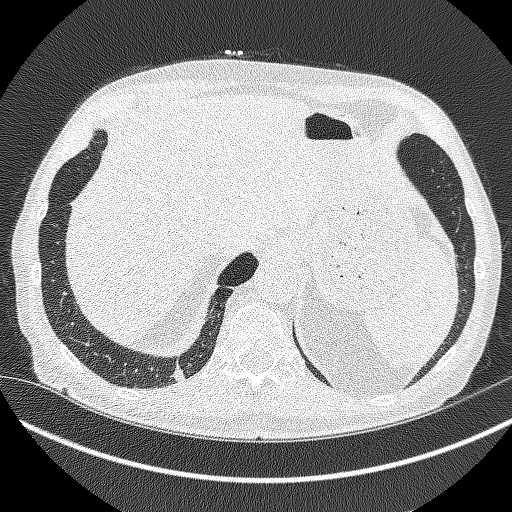
[im 128/364  lung]
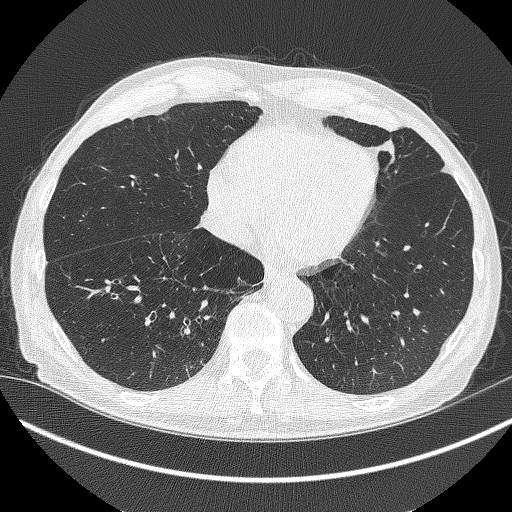
[im 146/364  mediastinal]
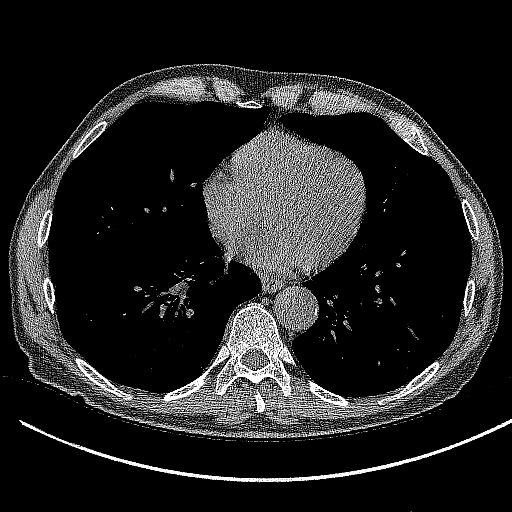
[im 146/364  lung]
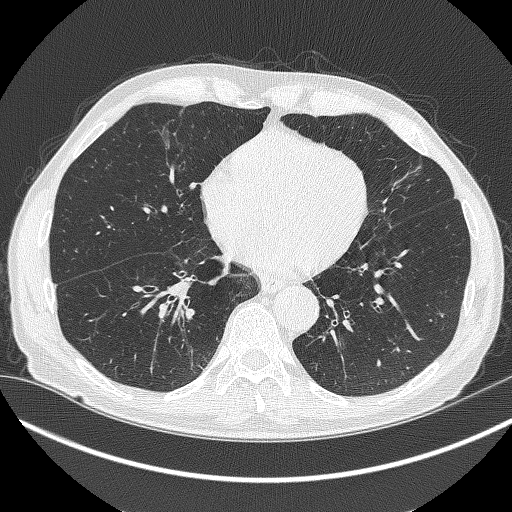
[im 182/364  lung]
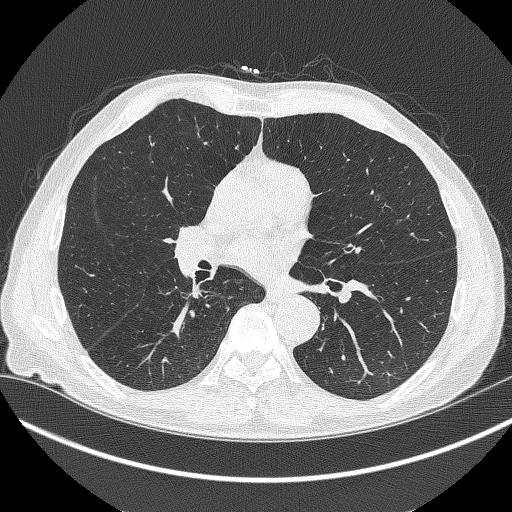
[im 218/364  lung]
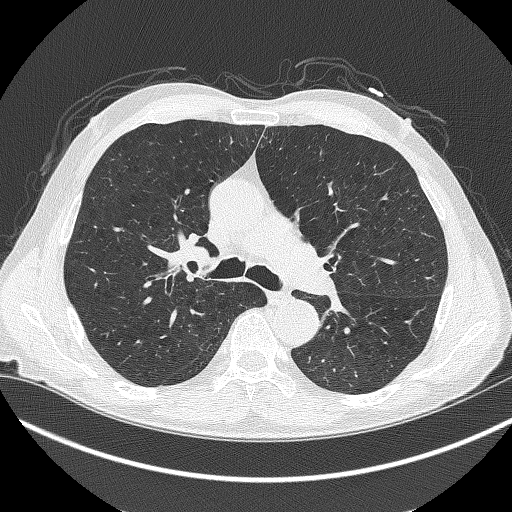
[im 236/364  lung]
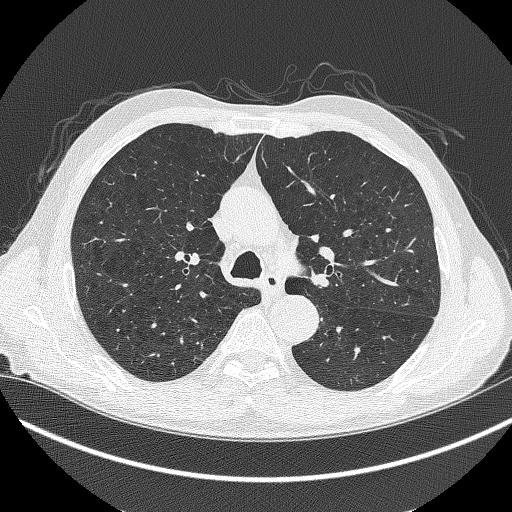
[im 273/364  mediastinal]
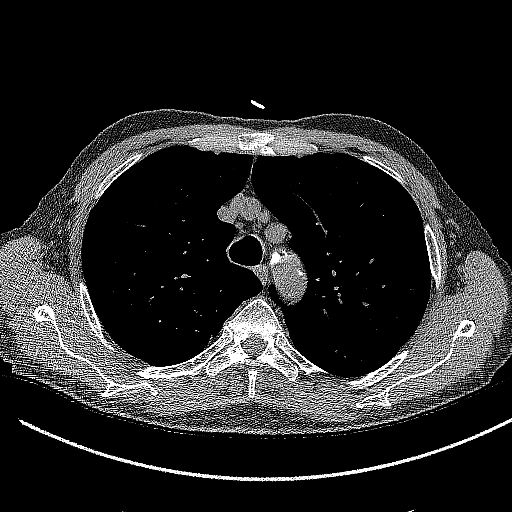
[im 273/364  lung]
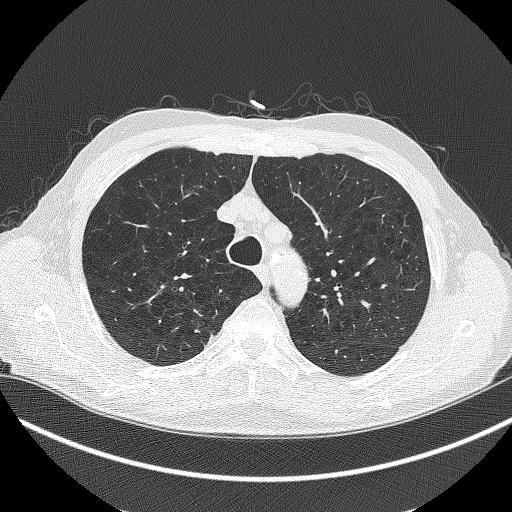
[im 309/364  lung]
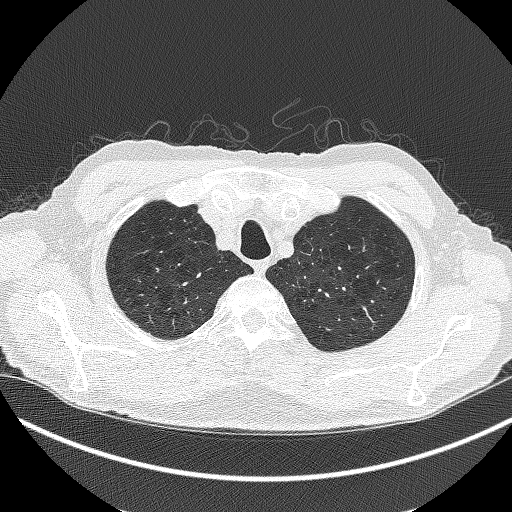
[im 345/364  lung]
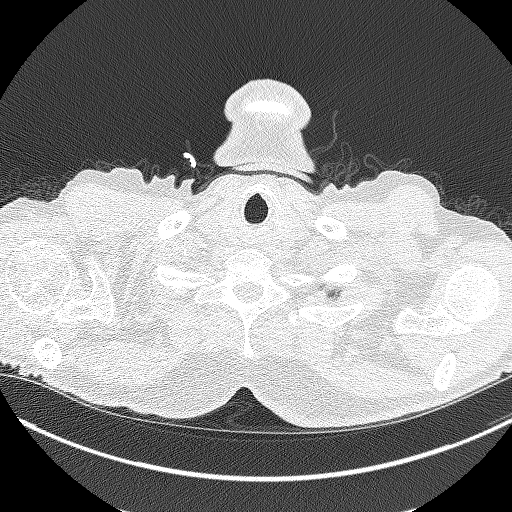

[Series 5: coronals lung 1.00 cor · coronal · 0.68mm/px · 3 of 269 slices shown]
[im 54/269  lung]
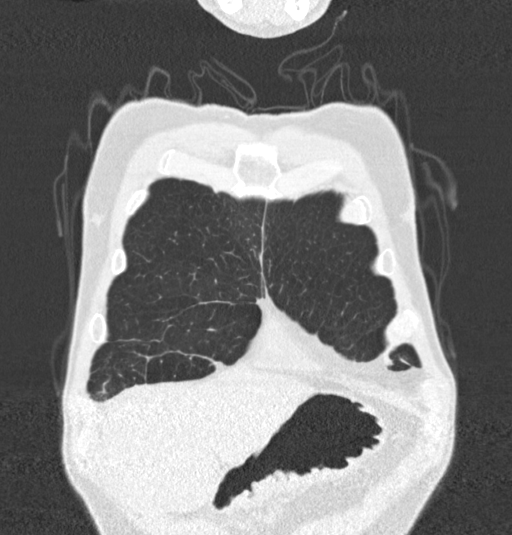
[im 108/269  lung]
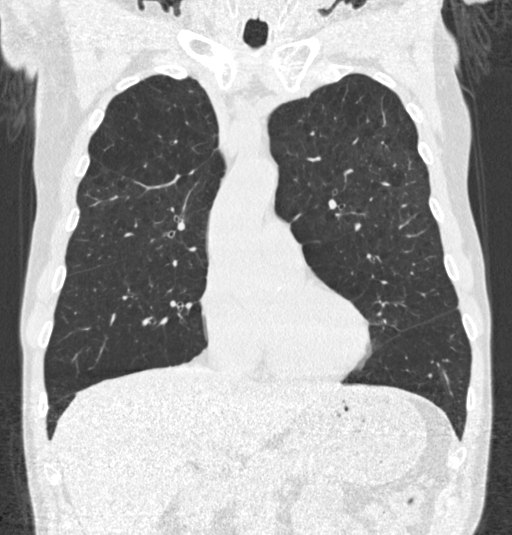
[im 161/269  lung]
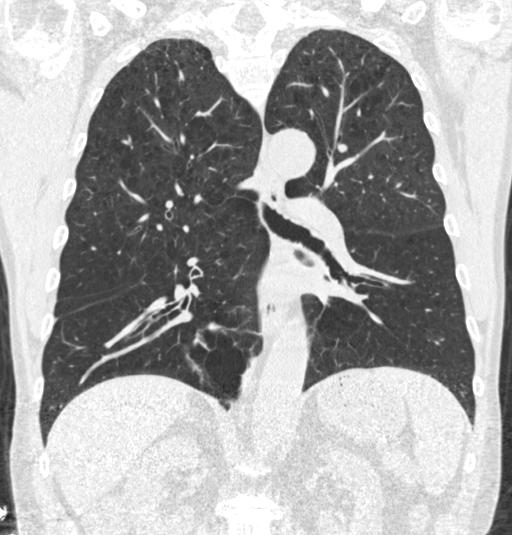

[14 of 40 positions shown; findings below may reference images not displayed]

FINDINGS: Cardiovascular: Heart size is normal. There is no significant
pericardial fluid, thickening or pericardial calcification. There is
aortic atherosclerosis, as well as atherosclerosis of the great
vessels of the mediastinum and the coronary arteries, including
calcified atherosclerotic plaque in the left main coronary artery.
Calcifications of the aortic valve.

Mediastinum/Nodes: No pathologically enlarged mediastinal or hilar
lymph nodes. Please note that accurate exclusion of hilar adenopathy
is limited on noncontrast CT scans. Esophagus is unremarkable in
appearance. No axillary lymphadenopathy.

Lungs/Pleura: Small pulmonary nodules are again noted in the lungs,
largest of which is in the medial aspect of the right upper lobe
(axial image 101 of series 3), with a volume derived mean diameter
of only 2.5 mm. No larger more suspicious appearing pulmonary
nodules or masses are noted. No acute consolidative airspace
disease. No pleural effusions. Diffuse bronchial wall thickening
with moderate centrilobular and paraseptal emphysema.

Upper Abdomen: Aortic atherosclerosis.

Musculoskeletal: Multiple low-attenuation lesions in the kidneys
bilaterally, largest of which is a 4.4 x 3.3 cm exophytic
low-attenuation lesion extending laterally off the interpolar region
of the right kidney, incompletely characterized on today's
non-contrast CT examination, but similar to prior studies and
statistically likely to represent cysts.
IMPRESSION: 1. Lung-RADS 2S, benign appearance or behavior. Continue annual
screening with low-dose chest CT without contrast in 12 months.
2. The "S" modifier above refers to potentially clinically
significant non lung cancer related findings. Specifically, there is
aortic atherosclerosis, in addition to left main coronary artery
disease. Please note that although the presence of coronary artery
calcium documents the presence of coronary artery disease, the
severity of this disease and any potential stenosis cannot be
assessed on this non-gated CT examination. Assessment for potential
risk factor modification, dietary therapy or pharmacologic therapy
may be warranted, if clinically indicated.
3. Mild diffuse bronchial wall thickening with moderate
centrilobular and paraseptal emphysema; imaging findings suggestive
of underlying COPD.
4. There are calcifications of the aortic valve. Echocardiographic
correlation for evaluation of potential valvular dysfunction may be
warranted if clinically indicated.

Aortic Atherosclerosis (QMJF0-KLN.N) and Emphysema (QMJF0-RR3.E).

## 2022-03-17 DIAGNOSIS — J449 Chronic obstructive pulmonary disease, unspecified: Secondary | ICD-10-CM | POA: Diagnosis not present

## 2022-05-03 ENCOUNTER — Other Ambulatory Visit: Payer: Self-pay | Admitting: Family Medicine

## 2022-05-03 DIAGNOSIS — J302 Other seasonal allergic rhinitis: Secondary | ICD-10-CM

## 2022-05-05 NOTE — Telephone Encounter (Signed)
Unable to refill per protocol, Rx expired. Discontinued 09/18/21, change in therapy.  Requested Prescriptions  Pending Prescriptions Disp Refills   ipratropium (ATROVENT) 0.03 % nasal spray [Pharmacy Med Name: IPRATROPIUM 0.03% SPRAY]  1    Sig: USE 1 SPRAY INTO BOTH NOSTRILS DAILY AS NEEDED.     Off-Protocol Failed - 05/03/2022  9:15 PM      Failed - Medication not assigned to a protocol, review manually.      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

## 2022-06-03 ENCOUNTER — Other Ambulatory Visit: Payer: Self-pay

## 2022-06-03 DIAGNOSIS — Z0000 Encounter for general adult medical examination without abnormal findings: Secondary | ICD-10-CM

## 2022-06-03 DIAGNOSIS — R972 Elevated prostate specific antigen [PSA]: Secondary | ICD-10-CM

## 2022-06-03 DIAGNOSIS — I700 Atherosclerosis of aorta: Secondary | ICD-10-CM

## 2022-06-03 DIAGNOSIS — F411 Generalized anxiety disorder: Secondary | ICD-10-CM

## 2022-06-03 DIAGNOSIS — R7309 Other abnormal glucose: Secondary | ICD-10-CM

## 2022-06-03 DIAGNOSIS — E785 Hyperlipidemia, unspecified: Secondary | ICD-10-CM

## 2022-06-03 DIAGNOSIS — J432 Centrilobular emphysema: Secondary | ICD-10-CM

## 2022-06-04 ENCOUNTER — Other Ambulatory Visit: Payer: 59

## 2022-06-04 DIAGNOSIS — R7309 Other abnormal glucose: Secondary | ICD-10-CM | POA: Diagnosis not present

## 2022-06-04 DIAGNOSIS — J432 Centrilobular emphysema: Secondary | ICD-10-CM | POA: Diagnosis not present

## 2022-06-04 DIAGNOSIS — E785 Hyperlipidemia, unspecified: Secondary | ICD-10-CM | POA: Diagnosis not present

## 2022-06-04 DIAGNOSIS — I700 Atherosclerosis of aorta: Secondary | ICD-10-CM | POA: Diagnosis not present

## 2022-06-04 LAB — CBC WITH DIFFERENTIAL/PLATELET
Absolute Monocytes: 516 cells/uL (ref 200–950)
MCH: 29.9 pg (ref 27.0–33.0)
Neutrophils Relative %: 64.3 %
RBC: 5.36 10*6/uL (ref 4.20–5.80)
RDW: 12.3 % (ref 11.0–15.0)
Total Lymphocyte: 23.4 %
WBC: 6 10*3/uL (ref 3.8–10.8)

## 2022-06-05 LAB — LIPID PANEL
Cholesterol: 212 mg/dL — ABNORMAL HIGH (ref <200)
HDL: 65 mg/dL (ref >=40)
LDL Cholesterol (Calc): 122 mg/dL (calc) — ABNORMAL HIGH
Non-HDL Cholesterol (Calc): 147 mg/dL (calc) — ABNORMAL HIGH (ref <130)
Total CHOL/HDL Ratio: 3.3 (calc) (ref <5.0)
Triglycerides: 136 mg/dL (ref <150)

## 2022-06-05 LAB — CBC WITH DIFFERENTIAL/PLATELET
Basophils Absolute: 60 cells/uL (ref 0–200)
Basophils Relative: 1 %
Eosinophils Absolute: 162 cells/uL (ref 15–500)
Eosinophils Relative: 2.7 %
HCT: 49.1 % (ref 38.5–50.0)
Hemoglobin: 16 g/dL (ref 13.2–17.1)
Lymphs Abs: 1404 cells/uL (ref 850–3900)
MCHC: 32.6 g/dL (ref 32.0–36.0)
MCV: 91.6 fL (ref 80.0–100.0)
MPV: 12.2 fL (ref 7.5–12.5)
Monocytes Relative: 8.6 %
Neutro Abs: 3858 cells/uL (ref 1500–7800)
Platelets: 223 10*3/uL (ref 140–400)

## 2022-06-05 LAB — COMPLETE METABOLIC PANEL WITH GFR
AG Ratio: 1.9 (calc) (ref 1.0–2.5)
ALT: 10 U/L (ref 9–46)
AST: 17 U/L (ref 10–35)
Albumin: 4.6 g/dL (ref 3.6–5.1)
Alkaline phosphatase (APISO): 71 U/L (ref 35–144)
BUN: 15 mg/dL (ref 7–25)
CO2: 31 mmol/L (ref 20–32)
Calcium: 10 mg/dL (ref 8.6–10.3)
Chloride: 102 mmol/L (ref 98–110)
Creat: 1.2 mg/dL (ref 0.70–1.28)
Globulin: 2.4 g/dL (calc) (ref 1.9–3.7)
Glucose, Bld: 91 mg/dL (ref 65–99)
Potassium: 4.8 mmol/L (ref 3.5–5.3)
Sodium: 141 mmol/L (ref 135–146)
Total Bilirubin: 0.5 mg/dL (ref 0.2–1.2)
Total Protein: 7 g/dL (ref 6.1–8.1)
eGFR: 63 mL/min/{1.73_m2} (ref >=60)

## 2022-06-05 LAB — HEMOGLOBIN A1C
Hgb A1c MFr Bld: 5.5 % of total Hgb (ref <5.7)
Mean Plasma Glucose: 111 mg/dL
eAG (mmol/L): 6.2 mmol/L

## 2022-06-05 LAB — TSH: TSH: 3.09 mIU/L (ref 0.40–4.50)

## 2022-06-05 LAB — PSA: PSA: 4.55 ng/mL — ABNORMAL HIGH

## 2022-06-11 ENCOUNTER — Encounter: Payer: Self-pay | Admitting: Family Medicine

## 2022-06-11 ENCOUNTER — Ambulatory Visit (INDEPENDENT_AMBULATORY_CARE_PROVIDER_SITE_OTHER): Payer: 59 | Admitting: Family Medicine

## 2022-06-11 VITALS — BP 129/76 | HR 88 | Ht 67.0 in | Wt 151.0 lb

## 2022-06-11 DIAGNOSIS — Z0000 Encounter for general adult medical examination without abnormal findings: Secondary | ICD-10-CM | POA: Diagnosis not present

## 2022-06-11 DIAGNOSIS — F338 Other recurrent depressive disorders: Secondary | ICD-10-CM

## 2022-06-11 DIAGNOSIS — J432 Centrilobular emphysema: Secondary | ICD-10-CM | POA: Diagnosis not present

## 2022-06-11 DIAGNOSIS — F411 Generalized anxiety disorder: Secondary | ICD-10-CM

## 2022-06-11 DIAGNOSIS — I700 Atherosclerosis of aorta: Secondary | ICD-10-CM

## 2022-06-11 DIAGNOSIS — M06042 Rheumatoid arthritis without rheumatoid factor, left hand: Secondary | ICD-10-CM

## 2022-06-11 DIAGNOSIS — J302 Other seasonal allergic rhinitis: Secondary | ICD-10-CM | POA: Diagnosis not present

## 2022-06-11 DIAGNOSIS — E785 Hyperlipidemia, unspecified: Secondary | ICD-10-CM | POA: Diagnosis not present

## 2022-06-11 DIAGNOSIS — M06041 Rheumatoid arthritis without rheumatoid factor, right hand: Secondary | ICD-10-CM

## 2022-06-11 MED ORDER — PSEUDOEPHEDRINE HCL 30 MG PO TABS
30.0000 mg | ORAL_TABLET | Freq: Every day | ORAL | 3 refills | Status: DC | PRN
Start: 2022-06-11 — End: 2023-06-18

## 2022-06-11 MED ORDER — AEROCHAMBER PLUS FLO-VU W/MASK MISC
0 refills | Status: AC
Start: 2022-06-11 — End: ?

## 2022-06-11 MED ORDER — IPRATROPIUM BROMIDE 0.06 % NA SOLN
2.0000 | Freq: Four times a day (QID) | NASAL | 11 refills | Status: DC
Start: 2022-06-11 — End: 2023-07-01

## 2022-06-11 NOTE — Assessment & Plan Note (Signed)
Controlled w/ lifestyle changes improvements °On Wellbutrin with improvement °

## 2022-06-11 NOTE — Assessment & Plan Note (Signed)
Stable to improved °On Wellbutrin ° °

## 2022-06-11 NOTE — Patient Instructions (Addendum)
Thank you for coming to the office today.  Refilled Aerochamber + Mask, let me know if any issues with this one.  Rx Sudafed generic AS NEEDED  Start nasal steroid Flonase 2 sprays in each nostril daily for 4-6 weeks, may repeat course seasonally or as needed  Other meds updated.  Keep up with Pulmonology and Urology as scheduled.   Please schedule a Follow-up Appointment to: Return in about 6 months (around 12/11/2022) for 6 month follow-up COPD, Allergies, Updates.  If you have any other questions or concerns, please feel free to call the office or send a message through MyChart. You may also schedule an earlier appointment if necessary.  Additionally, you may be receiving a survey about your experience at our office within a few days to 1 week by e-mail or mail. We value your feedback.  Saralyn Pilar, DO [facility], [place]

## 2022-06-11 NOTE — Assessment & Plan Note (Signed)
Stable without complication Identified on CT imaging 06/2021 Asymptomatic currently On intermittent statin therapy to control cholesterol reduce ASCVD risk 

## 2022-06-11 NOTE — Assessment & Plan Note (Signed)
Controlled on intermittent statin Failed Atorvastatin 10mg  myalgias The 10-year ASCVD risk score (Arnett DK, et al., 2019) is: 24.3%   Plan: 1. Continue Rosuvastatin 10mg  TWICE weekly - highest tolerated dose, failed 3 times weekly 2. Continue ASA 81mg  for primary ASCVD risk reduction 3. Encourage improved lifestyle - low carb/cholesterol, reduce portion size, continue improving regular exercise 4. Follow-up yearly lipids

## 2022-06-11 NOTE — Assessment & Plan Note (Signed)
Recent flare but overall stable Followed by [facility] Pulm Dr Kathlene Cote on Low Dose Lung CA Screen CT yearly last 06/2021 Former smoker, now second hand smoke  Chronic sinusitis / post nasal drainage often triggers some upper airway symptoms for him  Plan: 1. Follow-up with Dr Meredeth Ide as planned - Continues on Incruse Ellipta, Flovent, Albuterol - Continue sinus treatments - Flonase, Atrovent, Azelastine sprays  He continues to use supplemental oxygen very rarely PRN, not covered by insurance He has hand nebulizer device as well

## 2022-06-11 NOTE — Assessment & Plan Note (Signed)
Stable Continue current regimen order rx Sudafed 30mg  daily sent 90 day supply, per pharmacy request since he was taking OTC regularly Continue Azelastine, Nasal saline

## 2022-06-11 NOTE — Progress Notes (Signed)
Subjective:    Patient ID: Derek Mayo, male    DOB: 04/01/1945, 77 y.o.   MRN: [id number]  Derek Mayo is a 77 y.o. male presenting on 06/11/2022 for Annual Exam   HPI  Here for Annual Physical and Lab Review  Allergic Rhinitis, Sinus Gradual improvement with sinus vs allergy, seems to have had viral URI onset. Wife also had symptoms. New order for spacer for inhaler w/ mask Re order Sudafed 30mg  AS NEEDED generic, takes 1-2 times per week, can cause urinary retention - Need re order Atrovent nasal AS NEEDED, it helps for congestion - He has nasal lavage machine as well   HYPERLIPIDEMIA: - Reports no concerns. Last lipid panel 05/2022 LDL up from 106 to 122 - Currently taking Rosuvastatin intermittent x 2 weekly   GAD Improved on Wellbutrin. History of seasonal affective concerns. Wife has had cancer history and this has been a stressor.    PMH Right Wrist Pain / Benign Essential Tremors   Centrilobular Emphysema COPD Severe Stage IV  // Allergic Rhinitis Followed by [facility] Pulmonology Dr Meredeth Ide, continued on current regimen with Incruse, Albuterol, Flovent, and Flonase, Nasal Saline. Next apt with Pulmonology July 2024 - Today he reports doing well. No recent flare - He uses Azelastine usually at night PRN. He uses ventolin PRN. He will use Flovent and Flonase daily. He also has Atrovent decongestant spray PRN. He will consider Pseudophed PRN only if need decongestant LDCT Screen scan 06/2021 - Needs re order Sudafed - He will be more physically active in AM while breathing is better with medicine, later in day physical and emotional can impact his breathing.   Updates Continues on E-bike, now doing 6-8 miles per day, much improved with his exercise and helps his breathing. Cannot get albuterol sulfate solution has used portable nebulizer hand held  Pulse rate with exercise   he has used Zpak since last visit, any early onset viral or infectious symptoms, start  with swollen lymph nodes, he cleared. He will need new order on Zpak Dr Meredeth Ide switched him to Levalbuterol - He has hand-held nebulizer machine - He fills his oxygen bottle, he starts with 1 L per minute if feeling bad, then he will go down to 0.5 L per minute, then reduce off and use rarely PRN over year, he is able to fill his own tank at airport.   Atherosclerosis on CT 06/2021   PROSTATE SCREENING / Elevated PSA / Mild BPH   PSA result 4.55 (05/2022) stable from 4.64 Improved urination overall now on therapy KNOWN family history of prostate CA, younger brother dx with mild prostate cancer s/p treated.   Established with [facility] Dr Richardo Hanks - On [facility] and continues Flomax regularity - He has ruled out prostate cancer - Now patient has significantly improved LUTS and no more nocturia       06/11/2022    8:33 AM 09/05/2021    9:08 AM 03/08/2021    8:19 AM  Depression screen PHQ 2/9  Decreased Interest 0 0 0  Down, Depressed, Hopeless 0 0 0  PHQ - 2 Score 0 0 0  Altered sleeping 0 0 0  Tired, decreased energy 0 0 0  Change in appetite 0 0 0  Feeling bad or failure about yourself  0 0 0  Trouble concentrating 0 0 0  Moving slowly or fidgety/restless 0 0 0  Suicidal thoughts 0 0 0  PHQ-9 Score 0 0 0  Difficult doing work/chores Not  difficult at all Not difficult at all Not difficult at all      06/11/2022    8:33 AM 09/05/2021    9:09 AM 03/08/2021    8:20 AM  GAD 7 : Generalized Anxiety Score  Nervous, Anxious, on Edge 0 0 0  Control/stop worrying 0 0 0  Worry too much - different things 0 0 0  Trouble relaxing 0 0 0  Restless 0 0 0  Easily annoyed or irritable 0 0 0  Afraid - awful might happen 0 0 0  Total GAD 7 Score 0 0 0  Anxiety Difficulty Not difficult at all Not difficult at all Not difficult at all      Past Medical History:  Diagnosis Date   Allergic rhinitis    Anemia    Chicken pox    COPD (chronic obstructive pulmonary disease)     Depression    Measles    Mumps    Premature beats    Past Surgical History:  Procedure Laterality Date   CHEST SURGERY     COLONOSCOPY WITH PROPOFOL N/A 06/25/2015   Procedure: COLONOSCOPY WITH PROPOFOL;  Surgeon: Elnita Maxwell, MD;  Location: [facility] ENDOSCOPY;  Service: Endoscopy;  Laterality: N/A;   KNEE SURGERY Left    removed broken cartilage   TONSILECTOMY/ADENOIDECTOMY WITH MYRINGOTOMY     TONSILLECTOMY     Social History   Socioeconomic History   Marital status: Married    Spouse name: Not on file   Number of children: Not on file   Years of education: Not on file   Highest education level: Not on file  Occupational History   Not on file  Tobacco Use   Smoking status: Every Day    Packs/day: 1.00    Years: 40.00    Additional pack years: 0.00    Total pack years: 40.00    Types: Cigars, Cigarettes    Last attempt to quit: 08/13/2012    Years since quitting: 9.8    Passive exposure: Current   Smokeless tobacco: Former  Building services engineer Use: Never used  Substance and Sexual Activity   Alcohol use: Not Currently    Alcohol/week: 7.0 standard drinks of alcohol    Types: 7 Cans of beer per week   Drug use: Not Currently   Sexual activity: Not on file  Other Topics Concern   Not on file  Social History Narrative   Not on file   Social Determinants of Health   Financial Resource Strain: Low Risk  (08/07/2020)   Overall Financial Resource Strain (CARDIA)    Difficulty of Paying Living Expenses: Not hard at all  Food Insecurity: No Food Insecurity (08/07/2020)   Hunger Vital Sign    Worried About Running Out of Food in the Last Year: Never true    Ran Out of Food in the Last Year: Never true  Transportation Needs: No Transportation Needs (08/07/2020)   PRAPARE - Administrator, Civil Service (Medical): No    Lack of Transportation (Non-Medical): No  Physical Activity: Inactive (08/07/2020)   Exercise Vital Sign    Days of Exercise per Week: 0  days    Minutes of Exercise per Session: 0 min  Stress: Stress Concern Present (08/07/2020)   [employer] of Occupational Health - Occupational Stress Questionnaire    Feeling of Stress : To some extent  Social Connections: Not on file  Intimate Partner Violence: Not on file   Family History  Problem Relation Age of Onset   Heart disease Father    AAA (abdominal aortic aneurysm) Brother    Prostate cancer Brother    Current Outpatient Medications on File Prior to Visit  Medication Sig   albuterol (VENTOLIN HFA) 108 (90 Base) MCG/ACT inhaler INHALE 2 PUFFS 4 TIMES A DAY AS NEEDED   Azelastine HCl 0.15 % SOLN Place 1 spray into both nostrils daily as needed.   buPROPion (WELLBUTRIN XL) 150 MG 24 hr tablet Take 1 tablet (150 mg total) by mouth daily.   fluticasone (FLONASE) 50 MCG/ACT nasal spray SPRAY 2 SPRAYS INTO EACH NOSTRIL EVERY DAY   fluticasone (FLOVENT HFA) 110 MCG/ACT inhaler Inhale 1 puff into the lungs 2 (two) times daily.   levalbuterol (XOPENEX) 1.25 MG/3ML nebulizer solution SMARTSIG:3 Milliliter(s) Via Nebulizer Every 6 Hours PRN   naproxen (NAPROSYN) 250 MG tablet Take 250 mg by mouth daily as needed.   rosuvastatin (CRESTOR) 10 MG tablet Take 1 tablet (10 mg total) by mouth 2 (two) times a week.   tamsulosin (FLOMAX) 0.4 MG CAPS capsule Take 1 capsule (0.4 mg total) by mouth daily.   umeclidinium bromide (INCRUSE ELLIPTA) 62.5 MCG/INH AEPB INHALE 1 INHALATION INTO THE LUNGS ONCE DAILY.   No current facility-administered medications on file prior to visit.    Review of Systems  Constitutional:  Negative for activity change, appetite change, chills, diaphoresis, fatigue and fever.  HENT:  Negative for congestion and hearing loss.   Eyes:  Negative for visual disturbance.  Respiratory:  Negative for cough, chest tightness, shortness of breath and wheezing.   Cardiovascular:  Negative for chest pain, palpitations and leg swelling.  Gastrointestinal:  Negative for  abdominal pain, constipation, diarrhea, nausea and vomiting.  Genitourinary:  Negative for dysuria, frequency and hematuria.  Musculoskeletal:  Negative for arthralgias and neck pain.  Skin:  Negative for rash.  Neurological:  Negative for dizziness, weakness, light-headedness, numbness and headaches.  Hematological:  Negative for adenopathy.  Psychiatric/Behavioral:  Negative for behavioral problems, dysphoric mood and sleep disturbance.    Per HPI unless specifically indicated above      Objective:    BP 129/76   Pulse 88   Ht  (1.702 m)   Wt 151 lb (68.5 kg)   BMI 23.65 kg/m   Wt Readings from Last 3 Encounters:  06/11/22 151 lb (68.5 kg)  12/17/21 150 lb (68 kg)  12/10/21 151 lb (68.5 kg)    Physical Exam Vitals and nursing note reviewed.  Constitutional:      General: He is not in acute distress.    Appearance: He is well-developed. He is not diaphoretic.     Comments: Well-appearing, comfortable, cooperative  HENT:     Head: Normocephalic and atraumatic.  Eyes:     General:        Right eye: No discharge.        Left eye: No discharge.     Conjunctiva/sclera: Conjunctivae normal.     Pupils: Pupils are equal, round, and reactive to light.  Neck:     Thyroid: No thyromegaly.  Cardiovascular:     Rate and Rhythm: Normal rate and regular rhythm.     Pulses: Normal pulses.     Heart sounds: Normal heart sounds. No murmur heard. Pulmonary:     Effort: Pulmonary effort is normal. No respiratory distress.     Breath sounds: Wheezing (mild course insp wheeze R>L) present. No rales.     Comments: Diffuse mild reduced air  movement, cough. Abdominal:     General: Bowel sounds are normal. There is no distension.     Palpations: Abdomen is soft. There is no mass.     Tenderness: There is no abdominal tenderness.  Musculoskeletal:        General: No tenderness. Normal range of motion.     Cervical back: Normal range of motion and neck supple.     Right lower leg:  No edema.     Left lower leg: No edema.     Comments: Upper / Lower Extremities: - Normal muscle tone, strength bilateral upper extremities 5/5, lower extremities 5/5  Lymphadenopathy:     Cervical: No cervical adenopathy.  Skin:    General: Skin is warm and dry.     Findings: No erythema or rash.  Neurological:     Mental Status: He is alert and oriented to person, place, and time.     Comments: Distal sensation intact to light touch all extremities  Psychiatric:        Mood and Affect: Mood normal.        Behavior: Behavior normal.        Thought Content: Thought content normal.     Comments: Well groomed, good eye contact, normal speech and thoughts      Results for orders placed or performed in visit on 06/03/22  TSH  Result Value Ref Range   TSH 3.09 0.40 - 4.50 mIU/L  PSA  Result Value Ref Range   PSA 4.55 (H) < OR = 4.00 ng/mL  Hemoglobin A1c  Result Value Ref Range   Hgb A1c MFr Bld 5.5 <5.7 % of total Hgb   Mean Plasma Glucose 111 mg/dL   eAG (mmol/L) 6.2 mmol/L  Lipid panel  Result Value Ref Range   Cholesterol 212 (H) <200 mg/dL   HDL 65 > OR = 40 mg/dL   Triglycerides 811 <914 mg/dL   LDL Cholesterol (Calc) 122 (H) mg/dL (calc)   Total CHOL/HDL Ratio 3.3 <5.0 (calc)   Non-HDL Cholesterol (Calc) 147 (H) <130 mg/dL (calc)  CBC with Differential/Platelet  Result Value Ref Range   WBC 6.0 3.8 - 10.8 Thousand/uL   RBC 5.36 4.20 - 5.80 Million/uL   Hemoglobin 16.0 13.2 - 17.1 g/dL   HCT 78.2 95.6 - 21.3 %   MCV 91.6 80.0 - 100.0 fL   MCH 29.9 27.0 - 33.0 pg   MCHC 32.6 32.0 - 36.0 g/dL   RDW 08.6 57.8 - 46.9 %   Platelets 223 140 - 400 Thousand/uL   MPV 12.2 7.5 - 12.5 fL   Neutro Abs 3,858 1,500 - 7,800 cells/uL   Lymphs Abs 1,404 850 - 3,900 cells/uL   Absolute Monocytes 516 200 - 950 cells/uL   Eosinophils Absolute 162 15 - 500 cells/uL   Basophils Absolute 60 0 - 200 cells/uL   Neutrophils Relative % 64.3 %   Total Lymphocyte 23.4 %   Monocytes  Relative 8.6 %   Eosinophils Relative 2.7 %   Basophils Relative 1.0 %  COMPLETE METABOLIC PANEL WITH GFR  Result Value Ref Range   Glucose, Bld 91 65 - 99 mg/dL   BUN 15 7 - 25 mg/dL   Creat 6.29 5.28 - 4.13 mg/dL   eGFR 63 > OR = 60 KG/MWN/0.27O5   BUN/Creatinine Ratio SEE NOTE: 6 - 22 (calc)   Sodium 141 135 - 146 mmol/L   Potassium 4.8 3.5 - 5.3 mmol/L   Chloride 102 98 - 110 mmol/L  CO2 31 20 - 32 mmol/L   Calcium 10.0 8.6 - 10.3 mg/dL   Total Protein 7.0 6.1 - 8.1 g/dL   Albumin 4.6 3.6 - 5.1 g/dL   Globulin 2.4 1.9 - 3.7 g/dL (calc)   AG Ratio 1.9 1.0 - 2.5 (calc)   Total Bilirubin 0.5 0.2 - 1.2 mg/dL   Alkaline phosphatase (APISO) 71 35 - 144 U/L   AST 17 10 - 35 U/L   ALT 10 9 - 46 U/L      Assessment & Plan:   Problem List Items Addressed This Visit     Aortic atherosclerosis    Stable without complication Identified on CT imaging 06/2021 Asymptomatic currently On intermittent statin therapy to control cholesterol reduce ASCVD risk      Centrilobular emphysema (HCC)    Recent flare but overall stable Followed by [facility] Pulm Dr Kathlene Cote on Low Dose Lung CA Screen CT yearly last 06/2021 Former smoker, now second hand smoke  Chronic sinusitis / post nasal drainage often triggers some upper airway symptoms for him  Plan: 1. Follow-up with Dr Meredeth Ide as planned - Continues on Incruse Ellipta, Flovent, Albuterol - Continue sinus treatments - Flonase, Atrovent, Azelastine sprays  He continues to use supplemental oxygen very rarely PRN, not covered by insurance He has hand nebulizer device as well      Relevant Medications   Spacer/Aero-Holding Chambers (AEROCHAMBER PLUS FLO-VU W/MASK) MISC   ipratropium (ATROVENT) 0.06 % nasal spray   pseudoephedrine (SUDAFED) 30 MG tablet   Chronic seasonal allergic rhinitis    Stable Continue current regimen order rx Sudafed 30mg  daily sent 90 day supply, per pharmacy request since he was taking OTC  regularly Continue Azelastine, Nasal saline      Relevant Medications   ipratropium (ATROVENT) 0.06 % nasal spray   pseudoephedrine (SUDAFED) 30 MG tablet   Dyslipidemia    Controlled on intermittent statin Failed Atorvastatin 10mg  myalgias The 10-year ASCVD risk score (Arnett DK, et al., 2019) is: 24.3%   Plan: 1. Continue Rosuvastatin 10mg  TWICE weekly - highest tolerated dose, failed 3 times weekly 2. Continue ASA 81mg  for primary ASCVD risk reduction 3. Encourage improved lifestyle - low carb/cholesterol, reduce portion size, continue improving regular exercise 4. Follow-up yearly lipids      GAD (generalized anxiety disorder)    Controlled w/ lifestyle changes improvements On Wellbutrin with improvement      Rheumatoid arthritis involving both hands   Seasonal affective disorder    Stable to improved On Wellbutrin       Other Visit Diagnoses     Annual physical exam    -  Primary       Updated Health Maintenance information Reviewed recent lab results with patient Encouraged improvement to lifestyle with diet and exercise Goal of weight loss  Refilled Aerochamber + Mask, let me know if any issues with this one.  Rx Sudafed generic AS NEEDED  Start nasal steroid Flonase 2 sprays in each nostril daily for 4-6 weeks, may repeat course seasonally or as needed  Other meds updated.  Keep up with Pulmonology and Urology as scheduled.   Meds ordered this encounter  Medications   Spacer/Aero-Holding Chambers (AEROCHAMBER PLUS FLO-VU W/MASK) MISC    Sig: Use with inhalers for inhalation as needed.    Dispense:  1 each    Refill:  0    Adult size w/ mask   ipratropium (ATROVENT) 0.06 % nasal spray    Sig: Place 2 sprays into  both nostrils 4 (four) times daily.    Dispense:  15 mL    Refill:  11   pseudoephedrine (SUDAFED) 30 MG tablet    Sig: Take 1 tablet (30 mg total) by mouth daily as needed for congestion.    Dispense:  30 tablet    Refill:  3      Follow up plan: Return in about 6 months (around 12/11/2022) for 6 month follow-up COPD, Allergies, Updates.  Saralyn Pilar, DO [facility] 06/11/2022, 8:16 AM

## 2022-07-28 ENCOUNTER — Other Ambulatory Visit: Payer: Self-pay | Admitting: Acute Care

## 2022-07-28 DIAGNOSIS — Z87891 Personal history of nicotine dependence: Secondary | ICD-10-CM

## 2022-07-28 DIAGNOSIS — Z122 Encounter for screening for malignant neoplasm of respiratory organs: Secondary | ICD-10-CM

## 2022-08-01 ENCOUNTER — Other Ambulatory Visit: Payer: Self-pay | Admitting: Family Medicine

## 2022-08-01 DIAGNOSIS — E785 Hyperlipidemia, unspecified: Secondary | ICD-10-CM

## 2022-08-01 NOTE — Telephone Encounter (Signed)
Requested Prescriptions  Refused Prescriptions Disp Refills   rosuvastatin (CRESTOR) 10 MG tablet [Pharmacy Med Name: ROSUVASTATIN CALCIUM 10 MG TAB] 24 tablet 3    Sig: TAKE 1 TABLET (10 MG TOTAL) BY MOUTH 2 (TWO) TIMES A WEEK.     Cardiovascular:  Antilipid - Statins 2 Failed - 08/01/2022  2:29 AM      Failed - Lipid Panel in normal range within the last 12 months    Cholesterol, Total  Date Value Ref Range Status  11/01/2014 170 100 - 199 mg/dL Final   Cholesterol  Date Value Ref Range Status  06/04/2022 212 (H) <200 mg/dL Final   LDL Cholesterol (Calc)  Date Value Ref Range Status  06/04/2022 122 (H) mg/dL (calc) Final    Comment:    Reference range: <100 . Desirable range <100 mg/dL for primary prevention;   <70 mg/dL for patients with CHD or diabetic patients  with > or = 2 CHD risk factors. Marland Kitchen LDL-C is now calculated using the Martin-Hopkins  calculation, which is a validated novel method providing  better accuracy than the Friedewald equation in the  estimation of LDL-C.  Horald Pollen et al. Lenox Ahr. 1610;960(45): 2061-2068  (http://education.[employer].com/faq/FAQ164)    HDL  Date Value Ref Range Status  06/04/2022 65 > OR = 40 mg/dL Final  40/98/1191 50 >47 mg/dL Final    Comment:    According to ATP-III Guidelines, HDL-C >59 mg/dL is considered a negative risk factor for CHD.    Triglycerides  Date Value Ref Range Status  06/04/2022 136 <150 mg/dL Final         Passed - Cr in normal range and within 360 days    Creat  Date Value Ref Range Status  06/04/2022 1.20 0.70 - 1.28 mg/dL Final         Passed - Patient is not pregnant      Passed - Valid encounter within last 12 months    Recent Outpatient Visits           1 month ago Annual physical exam   [place] [facility] Smitty Cords, DO   7 months ago Centrilobular emphysema [facility])   [place] [facility] Smitty Cords, DO   11 months  ago Annual physical exam   [place] [facility] Smitty Cords, DO   1 year ago GAD (generalized anxiety disorder)   [place] [facility] Smitty Cords, DO   1 year ago Annual physical exam   [place] [facility] Smitty Cords, DO       Future Appointments             In 4 months Althea Charon, Netta Neat, DO  [facility], [facility]   In 4 months Richardo Hanks, Laurette Schimke, MD [facility] [place]

## 2022-08-22 ENCOUNTER — Other Ambulatory Visit: Payer: Self-pay | Admitting: Family Medicine

## 2022-08-22 DIAGNOSIS — E785 Hyperlipidemia, unspecified: Secondary | ICD-10-CM

## 2022-08-25 NOTE — Telephone Encounter (Signed)
Requested Prescriptions  Pending Prescriptions Disp Refills   rosuvastatin (CRESTOR) 10 MG tablet [Pharmacy Med Name: ROSUVASTATIN CALCIUM 10 MG TAB] 24 tablet 3    Sig: TAKE 1 TABLET (10 MG TOTAL) BY MOUTH 2 (TWO) TIMES A WEEK.     Cardiovascular:  Antilipid - Statins 2 Failed - 08/22/2022  2:20 AM      Failed - Lipid Panel in normal range within the last 12 months    Cholesterol, Total  Date Value Ref Range Status  11/01/2014 170 100 - 199 mg/dL Final   Cholesterol  Date Value Ref Range Status  06/04/2022 212 (H) <200 mg/dL Final   LDL Cholesterol (Calc)  Date Value Ref Range Status  06/04/2022 122 (H) mg/dL (calc) Final    Comment:    Reference range: <100 . Desirable range <100 mg/dL for primary prevention;   <70 mg/dL for patients with CHD or diabetic patients  with > or = 2 CHD risk factors. Marland Kitchen LDL-C is now calculated using the Martin-Hopkins  calculation, which is a validated novel method providing  better accuracy than the Friedewald equation in the  estimation of LDL-C.  Horald Pollen et al. Lenox Ahr. 1610;960(45): 2061-2068  (http://education.[facility].com/faq/FAQ164)    HDL  Date Value Ref Range Status  06/04/2022 65 > OR = 40 mg/dL Final  40/98/1191 50 >47 mg/dL Final    Comment:    According to ATP-III Guidelines, HDL-C >59 mg/dL is considered a negative risk factor for CHD.    Triglycerides  Date Value Ref Range Status  06/04/2022 136 <150 mg/dL Final         Passed - Cr in normal range and within 360 days    Creat  Date Value Ref Range Status  06/04/2022 1.20 0.70 - 1.28 mg/dL Final         Passed - Patient is not pregnant      Passed - Valid encounter within last 12 months    Recent Outpatient Visits           2 months ago Annual physical exam   [place] [facility] Smitty Cords, DO   8 months ago Centrilobular emphysema [facility])   [place] [facility] Smitty C[facility], DO   11 months  ago Annual physical exam   [place] [facility] Smitty Cords, DO   1 year ago GAD (generalized anxiety disorder)   [place] [facility] Smitty Cords, DO   1 year ago Annual physical exam   [place] [facility] Smitty Cords, DO       Future Appointments             In 3 months Althea Charon, Netta Neat, DO  [place] [facility], [facility]   In 3 months Richardo Hanks, Laurette Schimke, MD [facility] [place]

## 2022-09-05 ENCOUNTER — Ambulatory Visit: Admission: RE | Admit: 2022-09-05 | Payer: 59 | Source: Ambulatory Visit

## 2022-09-23 DIAGNOSIS — J449 Chronic obstructive pulmonary disease, unspecified: Secondary | ICD-10-CM | POA: Diagnosis not present

## 2022-09-23 DIAGNOSIS — Z720 Tobacco use: Secondary | ICD-10-CM | POA: Diagnosis not present

## 2022-09-23 DIAGNOSIS — R918 Other nonspecific abnormal finding of lung field: Secondary | ICD-10-CM | POA: Diagnosis not present

## 2022-09-23 DIAGNOSIS — Z87891 Personal history of nicotine dependence: Secondary | ICD-10-CM | POA: Diagnosis not present

## 2022-10-08 ENCOUNTER — Ambulatory Visit
Admission: RE | Admit: 2022-10-08 | Discharge: 2022-10-08 | Disposition: A | Discharge status: Home / Self Care | Payer: 59 | Source: Ambulatory Visit | Attending: Acute Care | Admitting: Acute Care

## 2022-10-08 DIAGNOSIS — Z87891 Personal history of nicotine dependence: Secondary | ICD-10-CM | POA: Insufficient documentation

## 2022-10-08 DIAGNOSIS — Z122 Encounter for screening for malignant neoplasm of respiratory organs: Secondary | ICD-10-CM | POA: Insufficient documentation

## 2022-10-13 DIAGNOSIS — Z0279 Encounter for issue of other medical certificate: Secondary | ICD-10-CM

## 2022-10-15 ENCOUNTER — Other Ambulatory Visit: Payer: Self-pay

## 2022-10-15 DIAGNOSIS — Z122 Encounter for screening for malignant neoplasm of respiratory organs: Secondary | ICD-10-CM

## 2022-10-15 DIAGNOSIS — Z87891 Personal history of nicotine dependence: Secondary | ICD-10-CM

## 2022-11-04 DIAGNOSIS — H2513 Age-related nuclear cataract, bilateral: Secondary | ICD-10-CM | POA: Diagnosis not present

## 2022-12-11 ENCOUNTER — Other Ambulatory Visit: Payer: Self-pay | Admitting: Family Medicine

## 2022-12-11 ENCOUNTER — Ambulatory Visit: Payer: 59 | Admitting: Family Medicine

## 2022-12-11 ENCOUNTER — Encounter: Payer: Self-pay | Admitting: Family Medicine

## 2022-12-11 VITALS — BP 127/78 | HR 83 | Ht 67.0 in | Wt 146.0 lb

## 2022-12-11 DIAGNOSIS — R3912 Poor urinary stream: Secondary | ICD-10-CM

## 2022-12-11 DIAGNOSIS — N401 Enlarged prostate with lower urinary tract symptoms: Secondary | ICD-10-CM | POA: Diagnosis not present

## 2022-12-11 DIAGNOSIS — J432 Centrilobular emphysema: Secondary | ICD-10-CM | POA: Diagnosis not present

## 2022-12-11 DIAGNOSIS — Z23 Encounter for immunization: Secondary | ICD-10-CM

## 2022-12-11 DIAGNOSIS — F411 Generalized anxiety disorder: Secondary | ICD-10-CM

## 2022-12-11 DIAGNOSIS — M06041 Rheumatoid arthritis without rheumatoid factor, right hand: Secondary | ICD-10-CM

## 2022-12-11 DIAGNOSIS — I700 Atherosclerosis of aorta: Secondary | ICD-10-CM

## 2022-12-11 DIAGNOSIS — R972 Elevated prostate specific antigen [PSA]: Secondary | ICD-10-CM

## 2022-12-11 DIAGNOSIS — R7309 Other abnormal glucose: Secondary | ICD-10-CM

## 2022-12-11 DIAGNOSIS — Z0000 Encounter for general adult medical examination without abnormal findings: Secondary | ICD-10-CM

## 2022-12-11 MED ORDER — AZITHROMYCIN 250 MG PO TABS: ORAL_TABLET | ORAL | 0 refills | Status: DC

## 2022-12-11 NOTE — Progress Notes (Signed)
Subjective:    Patient ID: Derek Mayo, male    DOB: 27-Mar-1945, 77 y.o.   MRN: [id number]  Derek Mayo is a 76 y.o. male presenting on 12/11/2022 for COPD and Allergies   HPI  Discussed the use of AI scribe software for clinical note transcription with the patient, who gave verbal consent to proceed.      Elevated BP on COPD meds Home BP before meds 127/78  Allergic Rhinitis, Sinus Gradual improvement with sinus vs allergy, seems to have had viral URI onset. Wife also had symptoms. New order for spacer for inhaler w/ mask Re order Sudafed 30mg  AS NEEDED generic, takes 1-2 times per week, can cause urinary retention - Need re order Atrovent nasal AS NEEDED, it helps for congestion - He has nasal lavage machine as well   HYPERLIPIDEMIA: - Reports no concerns. Last lipid panel 05/2022 LDL up from 106 to 122 - Currently taking Rosuvastatin intermittent x 2 weekly   GAD Improved on Wellbutrin. History of seasonal affective concerns. Wife has had cancer history and this has been a stressor.    PMH Right Wrist Pain / Benign Essential Tremors   Centrilobular Emphysema COPD Severe Stage IV  // Allergic Rhinitis He was last seen 08/2022 Switched to QVAR inhaled steroid but issue  Cannot tell if it is activating properly or if he is inhaling the medicine  Worsening breathing 2pm and later often has worsening symptoms allergies / breathing Worse with physical or emotional stress   Followed by [facility] Pulmonology Dr Meredeth Ide, continued on current regimen with QVAR (OFF FLovent) Incruse, Albuterol, Flovent, and Flonase, Nasal Saline. Next apt with Pulmonology July 2024 - Today he reports doing well. No recent flare - He uses Azelastine usually at night PRN. He uses ventolin PRN. He will use Flovent and Flonase daily. He also has Atrovent decongestant spray PRN. He will consider Pseudophed PRN only if need decongestant LDCT Screen scan 06/2021 - He will be more physically  active in AM while breathing is better with medicine, later in day physical and emotional can impact his breathing.    - He has hand-held nebulizer machine - He fills his oxygen bottle, he starts with 1 L per minute if feeling bad, then he will go down to 0.5 L per minute, then reduce off and use rarely PRN over year, he is able to fill his own tank at airport.  Needs re order Zpak back up rx for future if he has flare.    Atherosclerosis on CT 06/2021   PROSTATE SCREENING / Elevated PSA / Mild BPH  KNOWN family history of prostate CA, younger brother dx with mild prostate cancer s/p treated.  Established with [facility] Dr Richardo Hanks - On [facility] and continues Flomax regularity - He has ruled out prostate cancer - Now patient has significantly improved LUTS and no more nocturia   He has established with new dentist / oral surgery He has had several issues lately and has had several dental extractions The patient also reports undergoing extensive dental work due to medication-induced tooth decay. They have opted for a staged approach to tooth extraction and denture fitting, due to concerns about the risks of general anesthesia with their severe COPD.  Health Maintenance:  Due for Flu Shot today  Already updated RSV 02/2022  Defer COVID vaccine      12/11/2022    8:26 AM 06/11/2022    8:33 AM 09/05/2021    9:08 AM  Depression screen PHQ  2/9  Decreased Interest 0 0 0  Down, Depressed, Hopeless 0 0 0  PHQ - 2 Score 0 0 0  Altered sleeping 0 0 0  Tired, decreased energy 0 0 0  Change in appetite 0 0 0  Feeling bad or failure about yourself  0 0 0  Trouble concentrating 0 0 0  Moving slowly or fidgety/restless 0 0 0  Suicidal thoughts 0 0 0  PHQ-9 Score 0 0 0  Difficult doing work/chores  Not difficult at all Not difficult at all    Social History   Tobacco Use   Smoking status: Every Day    Current packs/day: 0.00    Average packs/day: 1 pack/day for 40.0 years  (40.0 ttl pk-yrs)    Types: Cigars, Cigarettes    Start date: 08/13/1972    Last attempt to quit: 08/13/2012    Years since quitting: 10.3    Passive exposure: Current   Smokeless tobacco: Former  Building services engineer status: Never Used  Substance Use Topics   Alcohol use: Not Currently    Alcohol/week: 7.0 standard drinks of alcohol    Types: 7 Cans of beer per week   Drug use: Not Currently    Review of Systems Per HPI unless specifically indicated above     Objective:    BP 127/78 (BP Location: Left Arm, Cuff Size: Normal)   Pulse 83   Ht 5\' 7"  (1.702 m)   Wt 146 lb (66.2 kg)   SpO2 93%   BMI 22.87 kg/m   Wt Readings from Last 3 Encounters:  12/11/22 146 lb (66.2 kg)  06/11/22 151 lb (68.5 kg)  12/17/21 150 lb (68 kg)    Physical Exam Vitals and nursing note reviewed.  Constitutional:      General: He is not in acute distress.    Appearance: He is well-developed. He is not diaphoretic.     Comments: Well-appearing, comfortable, cooperative  HENT:     Head: Normocephalic and atraumatic.  Eyes:     General:        Right eye: No discharge.        Left eye: No discharge.     Conjunctiva/sclera: Conjunctivae normal.     Pupils: Pupils are equal, round, and reactive to light.  Neck:     Thyroid: No thyromegaly.  Cardiovascular:     Rate and Rhythm: Normal rate and regular rhythm.     Pulses: Normal pulses.     Heart sounds: Normal heart sounds. No murmur heard. Pulmonary:     Effort: Pulmonary effort is normal. No respiratory distress.     Breath sounds: No wheezing or rales.     Comments: Diffuse mild reduced air movement,  Abdominal:     General: Bowel sounds are normal. There is no distension.     Palpations: Abdomen is soft. There is no mass.     Tenderness: There is no abdominal tenderness.  Musculoskeletal:        General: No tenderness. Normal range of motion.     Cervical back: Normal range of motion and neck supple.     Right lower leg: No edema.      Left lower leg: No edema.     Comments: Upper / Lower Extremities: - Normal muscle tone, strength bilateral upper extremities 5/5, lower extremities 5/5  Lymphadenopathy:     Cervical: No cervical adenopathy.  Skin:    General: Skin is warm and dry.     Findings:  No erythema or rash.  Neurological:     Mental Status: He is alert and oriented to person, place, and time.     Comments: Distal sensation intact to light touch all extremities  Psychiatric:        Mood and Affect: Mood normal.        Behavior: Behavior normal.        Thought Content: Thought content normal.     Comments: Well groomed, good eye contact, normal speech and thoughts     I have personally reviewed the radiology report from 10/08/22 LDCT.  Procedure Component Value Ref Range Date/Time  CT CHEST LUNG CA SCREEN LOW DOSE W/O CM [[id number]] Resulted: 10/15/22 7829  Order Status: Completed Updated: 10/15/22 0835  Narrative:    CLINICAL DATA:  Former smoker, quit 10 years ago, 40 pack-year history.  EXAM: CT CHEST WITHOUT CONTRAST LOW-DOSE FOR LUNG CANCER SCREENING  TECHNIQUE: Multidetector CT imaging of the chest was performed following the standard protocol without IV contrast.  RADIATION DOSE REDUCTION: This exam was performed according to the departmental dose-optimization program which includes automated exposure control, adjustment of the mA and/or kV according to patient size and/or use of iterative reconstruction technique.  COMPARISON:  09/02/2021.  FINDINGS: Cardiovascular: Atherosclerotic calcification of the aorta, aortic valve and coronary arteries. Heart size normal. No pericardial effusion.  Mediastinum/Nodes: No pathologically enlarged mediastinal or axillary lymph nodes. Hilar regions are difficult to definitively evaluate without IV contrast. Esophagus is grossly unremarkable.  Lungs/Pleura: Centrilobular emphysema. No suspicious pulmonary nodules. Scattered mucoid impaction. No  pleural fluid. Debris is seen in the airway.  Upper Abdomen: Visualized portions of the liver, gallbladder and adrenal glands are grossly unremarkable. Low-attenuation lesions in the kidneys. No specific follow-up necessary. Visualized portions of the spleen, pancreas, stomach and bowel are grossly unremarkable. No upper abdominal adenopathy. Partially imaged midline fat containing ventral hernia.  Musculoskeletal: Degenerative changes in the spine. Slight compression of the T6 and T12 superior endplates, unchanged. Previously questioned sclerosis in the right anterolateral sixth rib is not readily appreciated. No worrisome lytic or sclerotic lesions.  IMPRESSION: 1. Lung-RADS 1, negative. Continue annual screening with low-dose chest CT without contrast in 12 months. 2. Aortic atherosclerosis (ICD10-I70.0). Coronary artery calcification. 3.  Emphysema (ICD10-J43.9).   Electronically Signed   By: Leanna Battles M.D.   On: 10/15/2022 08:33   Results for orders placed or performed in visit on 06/03/22  TSH  Result Value Ref Range   TSH 3.09 0.40 - 4.50 mIU/L  PSA  Result Value Ref Range   PSA 4.55 (H) < OR = 4.00 ng/mL  Hemoglobin A1c  Result Value Ref Range   Hgb A1c MFr Bld 5.5 <5.7 % of total Hgb   Mean Plasma Glucose 111 mg/dL   eAG (mmol/L) 6.2 mmol/L  Lipid panel  Result Value Ref Range   Cholesterol 212 (H) <200 mg/dL   HDL 65 > OR = 40 mg/dL   Triglycerides 562 <130 mg/dL   LDL Cholesterol (Calc) 122 (H) mg/dL (calc)   Total CHOL/HDL Ratio 3.3 <5.0 (calc)   Non-HDL Cholesterol (Calc) 147 (H) <130 mg/dL (calc)  CBC with Differential/Platelet  Result Value Ref Range   WBC 6.0 3.8 - 10.8 Thousand/uL   RBC 5.36 4.20 - 5.80 Million/uL   Hemoglobin 16.0 13.2 - 17.1 g/dL   HCT 86.5 78.4 - 69.6 %   MCV 91.6 80.0 - 100.0 fL   MCH 29.9 27.0 - 33.0 pg   MCHC 32.6 32.0 -  36.0 g/dL   RDW 95.6 38.7 - 56.4 %   Platelets 223 140 - 400 Thousand/uL   MPV 12.2 7.5 - 12.5  fL   Neutro Abs 3,858 1,500 - 7,800 cells/uL   Lymphs Abs 1,404 850 - 3,900 cells/uL   Absolute Monocytes 516 200 - 950 cells/uL   Eosinophils Absolute 162 15 - 500 cells/uL   Basophils Absolute 60 0 - 200 cells/uL   Neutrophils Relative % 64.3 %   Total Lymphocyte 23.4 %   Monocytes Relative 8.6 %   Eosinophils Relative 2.7 %   Basophils Relative 1.0 %  COMPLETE METABOLIC PANEL WITH GFR  Result Value Ref Range   Glucose, Bld 91 65 - 99 mg/dL   BUN 15 7 - 25 mg/dL   Creat 3.32 9.51 - 8.84 mg/dL   eGFR 63 > OR = 60 ZY/SAY/3.01S0   BUN/Creatinine Ratio SEE NOTE: 6 - 22 (calc)   Sodium 141 135 - 146 mmol/L   Potassium 4.8 3.5 - 5.3 mmol/L   Chloride 102 98 - 110 mmol/L   CO2 31 20 - 32 mmol/L   Calcium 10.0 8.6 - 10.3 mg/dL   Total Protein 7.0 6.1 - 8.1 g/dL   Albumin 4.6 3.6 - 5.1 g/dL   Globulin 2.4 1.9 - 3.7 g/dL (calc)   AG Ratio 1.9 1.0 - 2.5 (calc)   Total Bilirubin 0.5 0.2 - 1.2 mg/dL   Alkaline phosphatase (APISO) 71 35 - 144 U/L   AST 17 10 - 35 U/L   ALT 10 9 - 46 U/L      Assessment & Plan:   Problem List Items Addressed This Visit     Aortic atherosclerosis (HCC)   Benign prostatic hyperplasia with weak urinary stream   Centrilobular emphysema (HCC) - Primary    Recent flare but overall stable Followed by [facility] Pulm Dr Kathlene Cote on Low Dose Lung CA Screen CT  Former smoker, now second hand smoke  Chronic sinusitis / post nasal drainage often triggers some upper airway symptoms for him  Plan: 1. Follow-up with Dr Meredeth Ide as planned - Continues on Incruse Ellipta, QVAR, Albuterol - Continue sinus treatments - Flonase, Atrovent, Azelastine sprays  He continues to use supplemental oxygen very rarely PRN, not covered by insurance He has hand nebulizer device as well  Consider future AirSupra  Rx Zpak for back up if flare AECOPD      Relevant Medications   QVAR REDIHALER 40 MCG/ACT inhaler   azithromycin (ZITHROMAX Z-PAK) 250 MG tablet   GAD  (generalized anxiety disorder)   Other Visit Diagnoses     Need for influenza vaccination       Relevant Orders   Flu Vaccine Trivalent High Dose (Fluad) (Completed)       Assessment and Plan    Dental Health Patient undergoing extensive dental work due to decay from medication and age. Plan to extract teeth in stages due to risk of anesthesia with severe COPD. -Continue with planned dental work in stages as tolerated.  General Health Maintenance Patient has received RSV and initial COVID vaccines, but is hesitant about COVID booster due to previous COVID infection. -Administer seasonal flu vaccine today. -Continue to monitor CDC guidelines for RSV and COVID booster recommendations.   Meds ordered this encounter  Medications   azithromycin (ZITHROMAX Z-PAK) 250 MG tablet    Sig: Take 2 tabs (500mg  total) on Day 1. Take 1 tab (250mg ) daily for next 4 days.    Dispense:  6  tablet    Refill:  0     Follow up plan: Return in about 6 months (around 06/11/2023) for 6 month fasting lab only then 1 week later Annual Physical (AM early apt, Not friday).  Future labs ordered for 05/2023   Saralyn Pilar, DO [facility] 12/11/2022, 8:43 AM

## 2022-12-11 NOTE — Patient Instructions (Addendum)
Thank you for coming to the office today.  Consider the AirSupra rescue inhaler replacement of albuterol in the future, once available and approved for COPD  Keep working with [employer] manufacturer  Flu Shot today  No RSV vaccine this year  Consider COVID booster but not required.  DUE for FASTING BLOOD WORK (no food or drink after midnight before the lab appointment, only water or coffee without cream/sugar on the morning of)  SCHEDULE "Lab Only" visit in the morning at the clinic for lab draw in 6  - Make sure Lab Only appointment is at about 1 week before your next appointment, so that results will be available  For Lab Results, once available within 2-3 days of blood draw, you can can log in to MyChart online to view your results and a brief explanation. Also, we can discuss results at next follow-up visit.   Please schedule a Follow-up Appointment to: Return in about 6 months (around 06/11/2023) for 6 month fasting lab only then 1 week later Annual Physical (AM early apt, Not friday).  If you have any other questions or concerns, please feel free to call the office or send a message through MyChart. You may also schedule an earlier appointment if necessary.  Additionally, you may be receiving a survey about your experience at our office within a few days to 1 week by e-mail or mail. We value your feedback.  Saralyn Pilar, DO [facility], [place]

## 2022-12-11 NOTE — Assessment & Plan Note (Addendum)
Recent flare but overall stable Followed by [facility] Pulm Dr Kathlene Cote on Low Dose Lung CA Screen CT  Former smoker, now second hand smoke  Chronic sinusitis / post nasal drainage often triggers some upper airway symptoms for him  Plan: 1. Follow-up with Dr Meredeth Ide as planned - Continues on Incruse Ellipta, QVAR, Albuterol - Continue sinus treatments - Flonase, Atrovent, Azelastine sprays  He continues to use supplemental oxygen very rarely PRN, not covered by insurance He has hand nebulizer device as well  Consider future AirSupra  Rx Zpak for back up if flare AECOPD

## 2022-12-16 ENCOUNTER — Ambulatory Visit (INDEPENDENT_AMBULATORY_CARE_PROVIDER_SITE_OTHER): Payer: 59 | Admitting: Urology

## 2022-12-16 ENCOUNTER — Encounter: Payer: Self-pay | Admitting: Urology

## 2022-12-16 VITALS — BP 153/91 | HR 87 | Ht 67.0 in | Wt 140.0 lb

## 2022-12-16 DIAGNOSIS — Z125 Encounter for screening for malignant neoplasm of prostate: Secondary | ICD-10-CM | POA: Diagnosis not present

## 2022-12-16 DIAGNOSIS — N138 Other obstructive and reflux uropathy: Secondary | ICD-10-CM

## 2022-12-16 DIAGNOSIS — N401 Enlarged prostate with lower urinary tract symptoms: Secondary | ICD-10-CM

## 2022-12-16 MED ORDER — TAMSULOSIN HCL 0.4 MG PO CAPS: 0.8000 mg | ORAL_CAPSULE | Freq: Every day | ORAL | 3 refills | Status: DC

## 2022-12-16 NOTE — Patient Instructions (Signed)

## 2022-12-16 NOTE — Progress Notes (Signed)
12/16/2022 8:16 AM   Derek Mayo 03-31-1945 [id number]  Reason for visit: Follow up PSA screening, lower urinary tract symptoms/BPH   HPI: 77 year old male with severe COPD who I saw in July 2023 for a PSA of 4.5 that had been stable over the last few years, as well as symptoms of weak stream, straining, and sensation of incomplete emptying.  He was taking Sudafed regularly for an upper respiratory infection which was likely contributed, and he opted for a trial of Flomax.    He did have improvement on Flomax, but feels like it is not as effective as originally.  He is interested in increasing the dose if possible.  Side effects of lightheadedness or dizziness were discussed.  His primary urinary issue is nocturia 1-2 times overnight, and we discussed basic nocturia prevention strategies.  His poor pulmonary function also likely contributes.  PVR today normal at 49ml.  PSA 4.55 from April 2024 which has been stable over the last 3 years, I previously had recommended discontinuing screening per the guideline recommendations, especially in the setting of his comorbidities.  Reassurance provided regarding PSA stability.  He also reports that he would not be able to have any type of general anesthesia secondary to his poor pulmonary function.  He had significant problems even with sedation from a colonoscopy.   -No further PSA screening per guideline recommendations -Flomax increased to 0.8 mg   Sondra Come, MD  [facility] [address], [address] [place], [place] [address] [phone]

## 2023-02-11 ENCOUNTER — Other Ambulatory Visit: Payer: Self-pay | Admitting: Family Medicine

## 2023-02-11 DIAGNOSIS — J302 Other seasonal allergic rhinitis: Secondary | ICD-10-CM

## 2023-02-11 NOTE — Telephone Encounter (Signed)
Requested Prescriptions  Pending Prescriptions Disp Refills   fluticasone (FLONASE) 50 MCG/ACT nasal spray [Pharmacy Med Name: FLUTICASONE PROP 50 MCG SPRAY] 48 mL 3    Sig: SPRAY 2 SPRAYS INTO EACH NOSTRIL EVERY DAY     Ear, Nose, and Throat: Nasal Preparations - Corticosteroids Passed - 02/11/2023  9:58 AM      Passed - Valid encounter within last 12 months    Recent Outpatient Visits           2 months ago Centrilobular emphysema [facility])   [place] [facility] [place], Netta Neat, DO   8 months ago Annual physical exam   [place] [facility] Smitty Cords, DO   1 year ago Centrilobular emphysema [facility])   [place] [facility] Smitty Cords, DO   1 year ago Annual physical exam   [facility] Smitty Cords, DO   1 year ago GAD (generalized anxiety disorder)   [place] [facility] Althea Charon, Netta Neat, DO       Future Appointments             In 4 months Althea Charon, Netta Neat, DO [place] [facility], [facility]   In 10 months Richardo Hanks, Laurette Schimke, MD [facility] [place]

## 2023-03-10 ENCOUNTER — Other Ambulatory Visit: Payer: Self-pay | Admitting: Family Medicine

## 2023-03-10 DIAGNOSIS — F411 Generalized anxiety disorder: Secondary | ICD-10-CM

## 2023-03-11 NOTE — Telephone Encounter (Signed)
 Requested Prescriptions  Pending Prescriptions Disp Refills   buPROPion  (WELLBUTRIN  XL) 150 MG 24 hr tablet [Pharmacy Med Name: BUPROPION  HCL XL 150 MG TABLET] 90 tablet 1    Sig: TAKE 1 TABLET BY MOUTH EVERY DAY     Psychiatry: Antidepressants - bupropion  Failed - 03/11/2023  8:30 AM      Failed - Last BP in normal range    BP Readings from Last 1 Encounters:  12/16/22 (!) 153/91         Passed - Cr in normal range and within 360 days    Creat  Date Value Ref Range Status  06/04/2022 1.20 0.70 - 1.28 mg/dL Final         Passed - AST in normal range and within 360 days    AST  Date Value Ref Range Status  06/04/2022 17 10 - 35 U/L Final         Passed - ALT in normal range and within 360 days    ALT  Date Value Ref Range Status  06/04/2022 10 9 - 46 U/L Final         Passed - Completed PHQ-2 or PHQ-9 in the last 360 days      Passed - Valid encounter within last 6 months    Recent Outpatient Visits           3 months ago Centrilobular emphysema [facility])   [facility] [place], Kayleen Party, DO   9 months ago Annual physical exam   [place] [facility] Raina Bunting, DO   1 year ago Centrilobular emphysema [facility])   [place] [facility] Raina Bunting, DO   1 year ago Annual physical exam   [place] [facility] Raina Bunting, DO   2 years ago GAD (generalized anxiety disorder)   [place] [facility] [place], Kayleen Party, DO       Future Appointments             In 3 months [place], Kayleen Party, DO [place] [facility]   In 9 months Estanislao Heimlich, Dennard Fisher, MD [facility] [place]

## 2023-03-23 DIAGNOSIS — J449 Chronic obstructive pulmonary disease, unspecified: Secondary | ICD-10-CM | POA: Diagnosis not present

## 2023-03-23 DIAGNOSIS — J302 Other seasonal allergic rhinitis: Secondary | ICD-10-CM | POA: Diagnosis not present

## 2023-06-11 ENCOUNTER — Other Ambulatory Visit: Payer: Self-pay

## 2023-06-11 DIAGNOSIS — R972 Elevated prostate specific antigen [PSA]: Secondary | ICD-10-CM

## 2023-06-11 DIAGNOSIS — E785 Hyperlipidemia, unspecified: Secondary | ICD-10-CM | POA: Diagnosis not present

## 2023-06-11 DIAGNOSIS — I700 Atherosclerosis of aorta: Secondary | ICD-10-CM

## 2023-06-11 DIAGNOSIS — R7309 Other abnormal glucose: Secondary | ICD-10-CM | POA: Diagnosis not present

## 2023-06-11 DIAGNOSIS — M06041 Rheumatoid arthritis without rheumatoid factor, right hand: Secondary | ICD-10-CM

## 2023-06-11 DIAGNOSIS — R3912 Poor urinary stream: Secondary | ICD-10-CM | POA: Diagnosis not present

## 2023-06-11 DIAGNOSIS — Z0000 Encounter for general adult medical examination without abnormal findings: Secondary | ICD-10-CM

## 2023-06-11 DIAGNOSIS — N401 Enlarged prostate with lower urinary tract symptoms: Secondary | ICD-10-CM

## 2023-06-12 LAB — CBC WITH DIFFERENTIAL/PLATELET
Absolute Lymphocytes: 1186 {cells}/uL (ref 850–3900)
Absolute Monocytes: 480 {cells}/uL (ref 200–950)
Basophils Absolute: 59 {cells}/uL (ref 0–200)
Basophils Relative: 1.2 %
Eosinophils Absolute: 127 {cells}/uL (ref 15–500)
Eosinophils Relative: 2.6 %
HCT: 49.4 % (ref 38.5–50.0)
Hemoglobin: 16 g/dL (ref 13.2–17.1)
MCH: 29.4 pg (ref 27.0–33.0)
MCHC: 32.4 g/dL (ref 32.0–36.0)
MCV: 90.8 fL (ref 80.0–100.0)
MPV: 12.1 fL (ref 7.5–12.5)
Monocytes Relative: 9.8 %
Neutro Abs: 3048 {cells}/uL (ref 1500–7800)
Neutrophils Relative %: 62.2 %
Platelets: 200 10*3/uL (ref 140–400)
RBC: 5.44 10*6/uL (ref 4.20–5.80)
RDW: 12.6 % (ref 11.0–15.0)
Total Lymphocyte: 24.2 %
WBC: 4.9 10*3/uL (ref 3.8–10.8)

## 2023-06-12 LAB — COMPLETE METABOLIC PANEL WITHOUT GFR
AG Ratio: 2.6 (calc) — ABNORMAL HIGH (ref 1.0–2.5)
ALT: 10 U/L (ref 9–46)
AST: 18 U/L (ref 10–35)
Albumin: 4.7 g/dL (ref 3.6–5.1)
Alkaline phosphatase (APISO): 59 U/L (ref 35–144)
BUN: 15 mg/dL (ref 7–25)
CO2: 29 mmol/L (ref 20–32)
Calcium: 9.9 mg/dL (ref 8.6–10.3)
Chloride: 102 mmol/L (ref 98–110)
Creat: 1.12 mg/dL (ref 0.70–1.28)
Globulin: 1.8 g/dL — ABNORMAL LOW (ref 1.9–3.7)
Glucose, Bld: 92 mg/dL (ref 65–99)
Potassium: 4.7 mmol/L (ref 3.5–5.3)
Sodium: 140 mmol/L (ref 135–146)
Total Bilirubin: 0.6 mg/dL (ref 0.2–1.2)
Total Protein: 6.5 g/dL (ref 6.1–8.1)

## 2023-06-12 LAB — LIPID PANEL
Cholesterol: 175 mg/dL (ref <200)
HDL: 58 mg/dL (ref >=40)
LDL Cholesterol (Calc): 93 mg/dL
Non-HDL Cholesterol (Calc): 117 mg/dL (ref <130)
Total CHOL/HDL Ratio: 3 (calc) (ref <5.0)
Triglycerides: 143 mg/dL (ref <150)

## 2023-06-12 LAB — HEMOGLOBIN A1C
Hgb A1c MFr Bld: 5.5 % (ref <5.7)
Mean Plasma Glucose: 111 mg/dL
eAG (mmol/L): 6.2 mmol/L

## 2023-06-12 LAB — PSA: PSA: 5.99 ng/mL — ABNORMAL HIGH (ref <=4.00)

## 2023-06-12 LAB — TSH: TSH: 2.76 m[IU]/L (ref 0.40–4.50)

## 2023-06-15 ENCOUNTER — Encounter: Payer: Self-pay | Admitting: Family Medicine

## 2023-06-18 ENCOUNTER — Ambulatory Visit (INDEPENDENT_AMBULATORY_CARE_PROVIDER_SITE_OTHER): Payer: Self-pay | Admitting: Family Medicine

## 2023-06-18 ENCOUNTER — Other Ambulatory Visit: Payer: Self-pay | Admitting: Family Medicine

## 2023-06-18 ENCOUNTER — Encounter: Payer: Self-pay | Admitting: Family Medicine

## 2023-06-18 VITALS — BP 122/80 | HR 86 | Resp 18 | Ht 67.0 in | Wt 144.8 lb

## 2023-06-18 DIAGNOSIS — J432 Centrilobular emphysema: Secondary | ICD-10-CM

## 2023-06-18 DIAGNOSIS — R3912 Poor urinary stream: Secondary | ICD-10-CM | POA: Diagnosis not present

## 2023-06-18 DIAGNOSIS — N401 Enlarged prostate with lower urinary tract symptoms: Secondary | ICD-10-CM | POA: Diagnosis not present

## 2023-06-18 DIAGNOSIS — I700 Atherosclerosis of aorta: Secondary | ICD-10-CM | POA: Diagnosis not present

## 2023-06-18 DIAGNOSIS — Z23 Encounter for immunization: Secondary | ICD-10-CM

## 2023-06-18 DIAGNOSIS — R972 Elevated prostate specific antigen [PSA]: Secondary | ICD-10-CM

## 2023-06-18 DIAGNOSIS — Z0000 Encounter for general adult medical examination without abnormal findings: Secondary | ICD-10-CM

## 2023-06-18 NOTE — Patient Instructions (Addendum)
 Thank you for coming to the office today.  Keep up with Low Dose CT Screening for Lungs Consider Heart version to check arteries if interested, but they are very similar so the image is not much different.  Prevnar-20 pneumonia vaccine today previous 2015. Now due for update.  Discuss the Zephyr Valve with Dr Jamal Mays  Keep on current treatments.  I will contact Dr Estanislao Heimlich about the elevated PSA 5.99  Medications refill in Summer 2025.  Please schedule a Follow-up Appointment to: Return in about 6 months (around 12/18/2023) for 6 month follow-up COPD, BPH PSA Urology Updates.  If you have any other questions or concerns, please feel free to call the office or send a message through MyChart. You may also schedule an earlier appointment if necessary.  Additionally, you may be receiving a survey about your experience at our office within a few days to 1 week by e-mail or mail. We value your feedback.  Domingo Friend, DO [facility], [place]

## 2023-06-18 NOTE — Progress Notes (Signed)
 Subjective:    Patient ID: Sears Oran, male    DOB: 12-15-1945, 78 y.o.   MRN: [id number]  Deago Burruss is a 78 y.o. male presenting on 06/18/2023 for Annual Exam   HPI  Discussed the use of AI scribe software for clinical note transcription with the patient, who gave verbal consent to proceed.  History of Present Illness   Eythan Jayne is a 78 year old male with COPD and elevated PSA who presents for follow-up on recent blood work results.   No significant side effects from his medications. He maintains good control of his cholesterol and blood sugar levels. He acknowledges not hydrating enough, which he believes may contribute to minor abnormalities in his blood work, such as a slightly low globulin level.     Elevated PSA Followed by Urology [facility] Dr Estanislao Heimlich PSA elevated now to 5.99 from 4.6 range Last visit 11/2022 Tamsuloin 0.4mg  dose increased to twice per day   Allergic Rhinitis, Sinus Gradual improvement with sinus vs allergy, seems to have had viral URI onset. Wife also had symptoms. Has spacer for inhaler w/ mask Continue Sudafed 30mg  AS NEEDED generic, takes 1-2 times per week, can cause urinary retention - Need re order Atrovent  nasal AS NEEDED, it helps for congestion - He has nasal lavage machine as well   HYPERLIPIDEMIA: - Reports no concerns. Last lipid panel 05/2022 LDL up from 106 to 122 - Currently taking Rosuvastatin  intermittent x 2 weekly   GAD Improved on Wellbutrin . History of seasonal affective concerns. Wife has had cancer history and this has been a stressor.    PMH Right Wrist Pain / Benign Essential Tremors   Centrilobular Emphysema COPD Severe Stage IV  // Allergic Rhinitis Followed by [facility] Pulmonology Dr Jamal Mays He was last seen 02/2023, has had issues with dyspnea later in afternoon due to medicines not lasting long enough, worse with physical activity late in the afternoon. Switched from QVAR and Incruse over to Trelegy  daily Still has symptoms of dyspnea worse with physical or emotional stress  Question Zephyr Valve minimally invasive procedure consideration from Dr Jamal Mays   - Today he reports doing well. No recent flare - He uses Azelastine  usually at night PRN. He uses ventolin  PRN. He will use Flovent  and Flonase  daily. He also has Atrovent  decongestant spray PRN. He will consider Pseudophed PRN only if need decongestant LDCT Screen scan 06/2021 - He will be more physically active in AM while breathing is better with medicine, later in day physical and emotional can impact his breathing.   - He has hand-held nebulizer machine - He fills his oxygen bottle, he starts with 1 L per minute if feeling bad, then he will go down to 0.5 L per minute, then reduce off and use rarely PRN over year, he is able to fill his own tank at airport.   Atherosclerosis on CT   Health Maintenance: Prevnar 20     06/18/2023    8:07 AM 12/11/2022    8:26 AM 06/11/2022    8:33 AM  Depression screen PHQ 2/9  Decreased Interest 0 0 0  Down, Depressed, Hopeless 0 0 0  PHQ - 2 Score 0 0 0  Altered sleeping 0 0 0  Tired, decreased energy 1 0 0  Change in appetite 0 0 0  Feeling bad or failure about yourself  0 0 0  Trouble concentrating 0 0 0  Moving slowly or fidgety/restless 0 0 0  Suicidal thoughts 0 0 0  PHQ-9 Score 1 0 0  Difficult doing work/chores Not difficult at all  Not difficult at all       06/18/2023    8:07 AM 12/11/2022    8:26 AM 06/11/2022    8:33 AM 09/05/2021    9:09 AM  GAD 7 : Generalized Anxiety Score  Nervous, Anxious, on Edge 0 1 0 0  Control/stop worrying 0 0 0 0  Worry too much - different things 0 0 0 0  Trouble relaxing 0 0 0 0  Restless 0 0 0 0  Easily annoyed or irritable 0 0 0 0  Afraid - awful might happen 0 0 0 0  Total GAD 7 Score 0 1 0 0  Anxiety Difficulty Not difficult at all  Not difficult at all Not difficult at all     Past Medical History:  Diagnosis Date   Allergic  rhinitis    Anemia    Chicken pox    COPD (chronic obstructive pulmonary disease) (HCC)    Depression    Measles    Mumps    Premature beats    Past Surgical History:  Procedure Laterality Date   CHEST SURGERY     COLONOSCOPY WITH PROPOFOL  N/A 06/25/2015   Procedure: COLONOSCOPY WITH PROPOFOL ;  Surgeon: Luella Sager, MD;  Location: [facility] ENDOSCOPY;  Service: Endoscopy;  Laterality: N/A;   KNEE SURGERY Left    removed broken cartilage   TONSILECTOMY/ADENOIDECTOMY WITH MYRINGOTOMY     TONSILLECTOMY     Social History   Socioeconomic History   Marital status: Married    Spouse name: Not on file   Number of children: Not on file   Years of education: Not on file   Highest education level: Not on file  Occupational History   Not on file  Tobacco Use   Smoking status: Every Day    Current packs/day: 0.00    Average packs/day: 1 pack/day for 40.0 years (40.0 ttl pk-yrs)    Types: Cigars, Cigarettes    Start date: 08/13/1972    Last attempt to quit: 08/13/2012    Years since quitting: 10.8    Passive exposure: Current   Smokeless tobacco: Former  Building services engineer status: Never Used  Substance and Sexual Activity   Alcohol use: Not Currently    Alcohol/week: 7.0 standard drinks of alcohol    Types: 7 Cans of beer per week   Drug use: Not Currently   Sexual activity: Not on file  Other Topics Concern   Not on file  Social History Narrative   Not on file   Social Drivers of Health   Financial Resource Strain: Low Risk  (08/07/2020)   Overall Financial Resource Strain (CARDIA)    Difficulty of Paying Living Expenses: Not hard at all  Food Insecurity: No Food Insecurity (08/07/2020)   Hunger Vital Sign    Worried About Running Out of Food in the Last Year: Never true    Ran Out of Food in the Last Year: Never true  Transportation Needs: No Transportation Needs (08/07/2020)   PRAPARE - Administrator, Civil Service (Medical): No    Lack of Transportation  (Non-Medical): No  Physical Activity: Inactive (08/07/2020)   Exercise Vital Sign    Days of Exercise per Week: 0 days    Minutes of Exercise per Session: 0 min  Stress: Stress Concern Present (08/07/2020)   [employer] of Occupational Health - Occupational Stress Questionnaire    Feeling  of Stress : To some extent  Social Connections: Not on file  Intimate Partner Violence: Not on file   Family History  Problem Relation Age of Onset   Heart disease Father    AAA (abdominal aortic aneurysm) Brother    Prostate cancer Brother    Current Outpatient Medications on File Prior to Visit  Medication Sig   albuterol  (PROVENTIL ) (2.5 MG/3ML) 0.083% nebulizer solution Take 2.5 mg by nebulization every 4 (four) hours as needed.   albuterol  (VENTOLIN  HFA) 108 (90 Base) MCG/ACT inhaler INHALE 2 PUFFS 4 TIMES A DAY AS NEEDED   Azelastine  HCl 0.15 % SOLN Place 1 spray into both nostrils daily as needed.   buPROPion  (WELLBUTRIN  XL) 150 MG 24 hr tablet TAKE 1 TABLET BY MOUTH EVERY DAY   fluticasone  (FLONASE ) 50 MCG/ACT nasal spray SPRAY 2 SPRAYS INTO EACH NOSTRIL EVERY DAY   INCRUSE ELLIPTA 62.5 MCG/ACT AEPB Inhale 1 puff into the lungs daily.   ipratropium (ATROVENT ) 0.06 % nasal spray Place 2 sprays into both nostrils 4 (four) times daily.   rosuvastatin  (CRESTOR ) 10 MG tablet TAKE 1 TABLET (10 MG TOTAL) BY MOUTH 2 (TWO) TIMES A WEEK.   Spacer/Aero-Holding Chambers (AEROCHAMBER PLUS FLO-VU W/MASK) MISC Use with inhalers for inhalation as needed.   tamsulosin  (FLOMAX ) 0.4 MG CAPS capsule Take 2 capsules (0.8 mg total) by mouth daily.   TRELEGY ELLIPTA 100-62.5-25 MCG/ACT AEPB Inhale 1 puff into the lungs daily.   No current facility-administered medications on file prior to visit.    Review of Systems  Constitutional:  Negative for activity change, appetite change, chills, diaphoresis, fatigue and fever.  HENT:  Negative for congestion and hearing loss.   Eyes:  Negative for visual  disturbance.  Respiratory:  Negative for cough, chest tightness, shortness of breath and wheezing.   Cardiovascular:  Negative for chest pain, palpitations and leg swelling.  Gastrointestinal:  Negative for abdominal pain, constipation, diarrhea, nausea and vomiting.  Genitourinary:  Negative for dysuria, frequency and hematuria.  Musculoskeletal:  Negative for arthralgias and neck pain.  Skin:  Negative for rash.  Neurological:  Negative for dizziness, weakness, light-headedness, numbness and headaches.  Hematological:  Negative for adenopathy.  Psychiatric/Behavioral:  Negative for behavioral problems, dysphoric mood and sleep disturbance.    Per HPI unless specifically indicated above     Objective:    BP 122/80 (BP Location: Left Arm, Patient Position: Sitting, Cuff Size: Normal)   Pulse 86   Resp 18   Ht 5\' 7"  (1.702 m)   Wt 144 lb 12.8 oz (65.7 kg)   SpO2 99%   BMI 22.68 kg/m   Wt Readings from Last 3 Encounters:  06/18/23 144 lb 12.8 oz (65.7 kg)  12/16/22 140 lb (63.5 kg)  12/11/22 146 lb (66.2 kg)    Physical Exam Vitals and nursing note reviewed.  Constitutional:      General: He is not in acute distress.    Appearance: He is well-developed. He is not diaphoretic.     Comments: Well-appearing, comfortable, cooperative  HENT:     Head: Normocephalic and atraumatic.  Eyes:     General:        Right eye: No discharge.        Left eye: No discharge.     Conjunctiva/sclera: Conjunctivae normal.     Pupils: Pupils are equal, round, and reactive to light.  Neck:     Thyroid : No thyromegaly.  Cardiovascular:     Rate and Rhythm: Normal rate  and regular rhythm.     Pulses: Normal pulses.     Heart sounds: Normal heart sounds. No murmur heard. Pulmonary:     Effort: Pulmonary effort is normal. No respiratory distress.     Breath sounds: Normal breath sounds. No wheezing or rales.     Comments: Diffuse mild reduced air movement Abdominal:     General: Bowel sounds  are normal. There is no distension.     Palpations: Abdomen is soft. There is no mass.     Tenderness: There is no abdominal tenderness.  Musculoskeletal:        General: No tenderness. Normal range of motion.     Cervical back: Normal range of motion and neck supple.     Comments: Upper / Lower Extremities: - Normal muscle tone, strength bilateral upper extremities 5/5, lower extremities 5/5  Lymphadenopathy:     Cervical: No cervical adenopathy.  Skin:    General: Skin is warm and dry.     Findings: No erythema or rash.  Neurological:     Mental Status: He is alert and oriented to person, place, and time.     Comments: Distal sensation intact to light touch all extremities  Psychiatric:        Mood and Affect: Mood normal.        Behavior: Behavior normal.        Thought Content: Thought content normal.     Comments: Well groomed, good eye contact, normal speech and thoughts     Results for orders placed or performed in visit on 06/11/23  TSH   Collection Time: 06/11/23  7:59 AM  Result Value Ref Range   TSH 2.76 0.40 - 4.50 mIU/L  PSA   Collection Time: 06/11/23  7:59 AM  Result Value Ref Range   PSA 5.99 (H) < OR = 4.00 ng/mL  CBC with Differential/Platelet   Collection Time: 06/11/23  7:59 AM  Result Value Ref Range   WBC 4.9 3.8 - 10.8 Thousand/uL   RBC 5.44 4.20 - 5.80 Million/uL   Hemoglobin 16.0 13.2 - 17.1 g/dL   HCT 16.1 09.6 - 04.5 %   MCV 90.8 80.0 - 100.0 fL   MCH 29.4 27.0 - 33.0 pg   MCHC 32.4 32.0 - 36.0 g/dL   RDW 40.9 81.1 - 91.4 %   Platelets 200 140 - 400 Thousand/uL   MPV 12.1 7.5 - 12.5 fL   Neutro Abs 3,048 1,500 - 7,800 cells/uL   Absolute Lymphocytes 1,186 850 - 3,900 cells/uL   Absolute Monocytes 480 200 - 950 cells/uL   Eosinophils Absolute 127 15 - 500 cells/uL   Basophils Absolute 59 0 - 200 cells/uL   Neutrophils Relative % 62.2 %   Total Lymphocyte 24.2 %   Monocytes Relative 9.8 %   Eosinophils Relative 2.6 %   Basophils Relative  1.2 %  COMPLETE METABOLIC PANEL WITH GFR   Collection Time: 06/11/23  7:59 AM  Result Value Ref Range   Glucose, Bld 92 65 - 99 mg/dL   BUN 15 7 - 25 mg/dL   Creat 7.82 9.56 - 2.13 mg/dL   BUN/Creatinine Ratio SEE NOTE: 6 - 22 (calc)   Sodium 140 135 - 146 mmol/L   Potassium 4.7 3.5 - 5.3 mmol/L   Chloride 102 98 - 110 mmol/L   CO2 29 20 - 32 mmol/L   Calcium  9.9 8.6 - 10.3 mg/dL   Total Protein 6.5 6.1 - 8.1 g/dL   Albumin 4.7 3.6 -  5.1 g/dL   Globulin 1.8 (L) 1.9 - 3.7 g/dL (calc)   AG Ratio 2.6 (H) 1.0 - 2.5 (calc)   Total Bilirubin 0.6 0.2 - 1.2 mg/dL   Alkaline phosphatase (APISO) 59 35 - 144 U/L   AST 18 10 - 35 U/L   ALT 10 9 - 46 U/L  Lipid panel   Collection Time: 06/11/23  7:59 AM  Result Value Ref Range   Cholesterol 175 <200 mg/dL   HDL 58 > OR = 40 mg/dL   Triglycerides 161 <096 mg/dL   LDL Cholesterol (Calc) 93 mg/dL (calc)   Total CHOL/HDL Ratio 3.0 <5.0 (calc)   Non-HDL Cholesterol (Calc) 117 <130 mg/dL (calc)  Hemoglobin E4V   Collection Time: 06/11/23  7:59 AM  Result Value Ref Range   Hgb A1c MFr Bld 5.5 <5.7 %   Mean Plasma Glucose 111 mg/dL   eAG (mmol/L) 6.2 mmol/L      Assessment & Plan:   Problem List Items Addressed This Visit     Aortic atherosclerosis (HCC)   Benign prostatic hyperplasia with weak urinary stream   Centrilobular emphysema (HCC)   Relevant Medications   TRELEGY ELLIPTA 100-62.5-25 MCG/ACT AEPB   INCRUSE ELLIPTA 62.5 MCG/ACT AEPB   Other Visit Diagnoses       Annual physical exam    -  Primary     Need for Streptococcus pneumoniae vaccination       Relevant Orders   Pneumococcal conjugate vaccine 20-valent (Completed)        Updated Health Maintenance information Reviewed recent lab results with patient Encouraged improvement to lifestyle with diet and exercise Goal of weight loss  Severe COPD Followed by Pulmonology - Ivette Marks. Dr Jamal Mays Severe COPD with limited lung function. Medication efficacy decreases by  afternoon, limiting activity. Considering Zephyr valve procedure to improve lung function. - Continue Trelegy for COPD management. (Note prior change from Incruse + Qvar) - Discuss Zephyr valve procedure with pulmonologist for potential candidacy.  Elevated PSA PSA increased to 5.9 (from 4.55) in past 1 year. Likely attributed to age and BPH Will route chart to Urologist. Note however, in prior notes they did recommend could pause PSA monitoring based on age and co-morbidities. Patient has been interested in continued PSA monitoring however. - Monitor PSA levels and consult with urologist for potential intervention.  BPH LUTS Followed by Urology On Tamsulosin  0.4mg  TWICE A DAY now with improvement.  Compromised immune system Compromised immune system. Discussed importance of vaccinations, including pneumonia vaccine. Recently received RSV vaccine. Discussed benefits of Prevnar 20 vaccine. - Administer Prevnar 20 pneumonia vaccine.  Wellness Visit Routine wellness visit. Discussed lung cancer screening and optional heart CT scan. Cholesterol, blood pressure, and blood sugar levels are well-managed. Thyroid , kidney, and liver function tests are normal. - Continue current cholesterol medication regimen. - Consider heart CT scan as an alternative to lung cancer screening. - Maintain regular monitoring of blood pressure and blood sugar levels.      Route note w/ comments to Dr Estanislao Heimlich PSA elevation, his next apt 11/2023. Requesting advice.   Orders Placed This Encounter  Procedures   Pneumococcal conjugate vaccine 20-valent    No orders of the defined types were placed in this encounter.    Follow up plan: Return in about 6 months (around 12/18/2023) for 6 month follow-up COPD, BPH PSA Urology Updates.  Domingo Friend, DO [facility] 06/18/2023, 8:10 AM

## 2023-06-23 ENCOUNTER — Telehealth: Payer: Self-pay

## 2023-06-23 NOTE — Telephone Encounter (Signed)
 Copied from CRM [id number]. Topic: Clinical - Lab/Test Results >> Jun 23, 2023  3:54 PM Carlatta H wrote: Reason for CRM: Patient received a call from rachel with no message left//Please return call//

## 2023-06-29 ENCOUNTER — Other Ambulatory Visit: Payer: Self-pay | Admitting: Family Medicine

## 2023-06-29 DIAGNOSIS — J302 Other seasonal allergic rhinitis: Secondary | ICD-10-CM

## 2023-07-01 NOTE — Telephone Encounter (Signed)
 Requested medication (s) are due for refill today: yes  Requested medication (s) are on the active medication list: yes  Last refill:  06/11/22 15 ml 11 RF  Future visit scheduled: yes  Notes to clinic:  med not delegated to a protocol   Requested Prescriptions  Pending Prescriptions Disp Refills   ipratropium (ATROVENT ) 0.06 % nasal spray [Pharmacy Med Name: IPRATROPIUM 0.06% SPRAY]  11    Sig: PLACE 2 SPRAYS INTO BOTH NOSTRILS 4 TIMES DAILY.     Off-Protocol Failed - 07/01/2023  8:37 AM      Failed - Medication not assigned to a protocol, review manually.      Passed - Valid encounter within last 12 months    Recent Outpatient Visits           1 week ago Annual physical exam   [facility] Raina Bunting, DO       Future Appointments             In 5 months Estanislao Heimlich, Dennard Fisher, MD [facility] [place]   In 5 months [place] Kayleen Party, DO [place] [facility], [facility]           Off-Protocol Failed - 07/01/2023  8:37 AM      Failed - Medication not assigned to a protocol, review manually.      Passed - Valid encounter within last 12 months    Recent Outpatient Visits           1 week ago Annual physical exam   [facility] [place], Kayleen Party, DO       Future Appointments             In 5 months Estanislao Heimlich, Dennard Fisher, MD [facility] [place]   In 5 months [place], Kayleen Party, DO [place] [facility] [place], [facility]

## 2023-07-16 ENCOUNTER — Other Ambulatory Visit: Payer: Self-pay | Admitting: Family Medicine

## 2023-07-16 DIAGNOSIS — E785 Hyperlipidemia, unspecified: Secondary | ICD-10-CM

## 2023-07-17 NOTE — Telephone Encounter (Signed)
 Requested Prescriptions  Pending Prescriptions Disp Refills   rosuvastatin  (CRESTOR ) 10 MG tablet [Pharmacy Med Name: ROSUVASTATIN  CALCIUM  10 MG TAB] 24 tablet 3    Sig: TAKE 1 TABLET (10 MG TOTAL) BY MOUTH 2 (TWO) TIMES A WEEK.     Cardiovascular:  Antilipid - Statins 2 Failed - 07/17/2023  9:35 AM      Failed - Lipid Panel in normal range within the last 12 months    Cholesterol, Total  Date Value Ref Range Status  11/01/2014 170 100 - 199 mg/dL Final   Cholesterol  Date Value Ref Range Status  06/11/2023 175 <200 mg/dL Final   LDL Cholesterol (Calc)  Date Value Ref Range Status  06/11/2023 93 mg/dL (calc) Final    Comment:    Reference range: <100 . Desirable range <100 mg/dL for primary prevention;   <70 mg/dL for patients with CHD or diabetic patients  with > or = 2 CHD risk factors. Aaron Aas LDL-C is now calculated using the Martin-Hopkins  calculation, which is a validated novel method providing  better accuracy than the Friedewald equation in the  estimation of LDL-C.  Melinda Sprawls et al. Erroll Heard. 1610;960(45): 2061-2068  (http://education.[employer].com/faq/FAQ164)    HDL  Date Value Ref Range Status  06/11/2023 58 > OR = 40 mg/dL Final  40/98/1191 50 >47 mg/dL Final    Comment:    According to ATP-III Guidelines, HDL-C >59 mg/dL is considered a negative risk factor for CHD.    Triglycerides  Date Value Ref Range Status  06/11/2023 143 <150 mg/dL Final         Passed - Cr in normal range and within 360 days    Creat  Date Value Ref Range Status  06/11/2023 1.12 0.70 - 1.28 mg/dL Final         Passed - Patient is not pregnant      Passed - Valid encounter within last 12 months    Recent Outpatient Visits           4 weeks ago Annual physical exam   [place] [facility] Raina Bunting, DO       Future Appointments             In 5 months Estanislao Heimlich, Dennard Fisher, MD [facility] [place]   In 5 months [place],  Kayleen Party, DO [place] [facility], [facility]

## 2023-07-27 ENCOUNTER — Other Ambulatory Visit

## 2023-07-27 DIAGNOSIS — R972 Elevated prostate specific antigen [PSA]: Secondary | ICD-10-CM

## 2023-07-28 ENCOUNTER — Ambulatory Visit: Payer: Self-pay | Admitting: Family Medicine

## 2023-07-28 LAB — PSA, TOTAL AND FREE

## 2023-07-29 ENCOUNTER — Other Ambulatory Visit: Payer: Self-pay | Admitting: Family Medicine

## 2023-07-29 DIAGNOSIS — R972 Elevated prostate specific antigen [PSA]: Secondary | ICD-10-CM

## 2023-08-03 ENCOUNTER — Other Ambulatory Visit

## 2023-08-03 DIAGNOSIS — R972 Elevated prostate specific antigen [PSA]: Secondary | ICD-10-CM

## 2023-08-05 LAB — PSA, TOTAL AND FREE
PSA, % Free: 13 % — ABNORMAL LOW (ref >25)
PSA, Free: 1 ng/mL
PSA, Total: 7.9 ng/mL — ABNORMAL HIGH (ref <=4.0)

## 2023-08-06 ENCOUNTER — Ambulatory Visit: Payer: Self-pay | Admitting: Family Medicine

## 2023-08-25 DIAGNOSIS — J302 Other seasonal allergic rhinitis: Secondary | ICD-10-CM | POA: Diagnosis not present

## 2023-08-25 DIAGNOSIS — J449 Chronic obstructive pulmonary disease, unspecified: Secondary | ICD-10-CM | POA: Diagnosis not present

## 2023-08-31 DIAGNOSIS — J302 Other seasonal allergic rhinitis: Secondary | ICD-10-CM | POA: Diagnosis not present

## 2023-08-31 DIAGNOSIS — R918 Other nonspecific abnormal finding of lung field: Secondary | ICD-10-CM | POA: Diagnosis not present

## 2023-08-31 DIAGNOSIS — J449 Chronic obstructive pulmonary disease, unspecified: Secondary | ICD-10-CM | POA: Diagnosis not present

## 2023-09-17 ENCOUNTER — Ambulatory Visit

## 2023-09-17 ENCOUNTER — Other Ambulatory Visit: Payer: Self-pay

## 2023-09-17 ENCOUNTER — Other Ambulatory Visit: Admission: RE | Admit: 2023-09-17 | Source: Home / Self Care

## 2023-09-17 DIAGNOSIS — N138 Other obstructive and reflux uropathy: Secondary | ICD-10-CM

## 2023-09-17 DIAGNOSIS — R972 Elevated prostate specific antigen [PSA]: Secondary | ICD-10-CM

## 2023-09-19 LAB — PHI SCORE REFLEX
% Free PSA: 12.3 %
PSA, Free: 0.82 ng/mL
Prostate Heath Index Score: 29.5
p2PSA: 9.4 pg/mL

## 2023-09-19 LAB — PROSTATE HEALTH INDEX: Prostate Specific Ag: 6.7 ng/mL — ABNORMAL HIGH (ref 0.0–3.9)

## 2023-09-21 ENCOUNTER — Encounter: Payer: Self-pay | Admitting: Acute Care

## 2023-09-22 ENCOUNTER — Other Ambulatory Visit: Payer: Self-pay | Admitting: Urology

## 2023-09-22 DIAGNOSIS — N138 Other obstructive and reflux uropathy: Secondary | ICD-10-CM

## 2023-09-24 ENCOUNTER — Ambulatory Visit (INDEPENDENT_AMBULATORY_CARE_PROVIDER_SITE_OTHER): Admitting: Urology

## 2023-09-24 ENCOUNTER — Other Ambulatory Visit: Payer: Self-pay

## 2023-09-24 VITALS — BP 157/93 | HR 87 | Ht 67.0 in | Wt 135.0 lb

## 2023-09-24 DIAGNOSIS — R972 Elevated prostate specific antigen [PSA]: Secondary | ICD-10-CM | POA: Diagnosis not present

## 2023-09-24 DIAGNOSIS — N138 Other obstructive and reflux uropathy: Secondary | ICD-10-CM | POA: Diagnosis not present

## 2023-09-24 DIAGNOSIS — N401 Enlarged prostate with lower urinary tract symptoms: Secondary | ICD-10-CM | POA: Diagnosis not present

## 2023-09-24 DIAGNOSIS — Z125 Encounter for screening for malignant neoplasm of prostate: Secondary | ICD-10-CM | POA: Diagnosis not present

## 2023-09-24 NOTE — Progress Notes (Signed)
   09/24/2023 11:23 AM   Derek Mayo Dec 31, 1945 [id number]  Reason for visit: Follow up elevated PSA, urinary symptoms  HPI: 78 year old comorbid male with severe COPD who I followed for the above issues since 2023.  He is unable to have any type of general anesthesia or sedation with his severe pulmonary function, had significant problems recently with sedation from a colonoscopy.  He is on Flomax  0.4 mg twice daily which improves his urinary symptoms.  PVRs have been normal.  He has a long history of mildly elevated PSA back to 4.7 in December 2019, 4.6 in 2023.  With his age and comorbidities I had recommended discontinuing screening per the guideline recommendations.  He had a PSA with PCP in April 2025 that was 6, recheck June 2025 was 7.9 with 13% free.  He opted for a PHI score. PHI from 09/13/2023 showed a PSA of 6.7, 12% free, PHI 29.5 indicating a less than 20% chance of prostate cancer.  We had a long conversation again today about his history of mildly elevated PSA, risks of screening after age 52, and reassuring PHI score.  We discussed options including MRI or biopsy, versus monitoring more of a watchful waiting approach.  He is in agreement for PSA monitoring alone with his other medical problems which I think is very reasonable.  He would like to have PSA drawn in 6 months with PCP.  Continue Flomax , refilled PSA in 6 months with PCP, RTC in person 1 year.  Consider MRI if significant increase in PSA   Derek JAYSON Burnet, MD  [facility] [address] [place], [place] [address] [phone]

## 2023-10-09 ENCOUNTER — Other Ambulatory Visit: Payer: Self-pay | Admitting: Family Medicine

## 2023-10-09 DIAGNOSIS — F411 Generalized anxiety disorder: Secondary | ICD-10-CM

## 2023-10-12 NOTE — Telephone Encounter (Signed)
 Requested Prescriptions  Pending Prescriptions Disp Refills   buPROPion  (WELLBUTRIN  XL) 150 MG 24 hr tablet [Pharmacy Med Name: BUPROPION  HCL XL 150 MG TABLET] 90 tablet 0    Sig: TAKE 1 TABLET BY MOUTH EVERY DAY     Psychiatry: Antidepressants - bupropion  Failed - 10/12/2023  4:00 PM      Failed - Last BP in normal range    BP Readings from Last 1 Encounters:  09/24/23 (!) 157/93         Passed - Cr in normal range and within 360 days    Creat  Date Value Ref Range Status  06/11/2023 1.12 0.70 - 1.28 mg/dL Final         Passed - AST in normal range and within 360 days    AST  Date Value Ref Range Status  06/11/2023 18 10 - 35 U/L Final         Passed - ALT in normal range and within 360 days    ALT  Date Value Ref Range Status  06/11/2023 10 9 - 46 U/L Final         Passed - Completed PHQ-2 or PHQ-9 in the last 360 days      Passed - Valid encounter within last 6 months    Recent Outpatient Visits           3 months ago Annual physical exam   [place] [facility] [place], Marsa PARAS, DO       Future Appointments             In 2 months Edman, Marsa PARAS, DO [place] [facility]   In 11 months Francisca, Redell BROCKS, MD [facility] [place]

## 2023-11-09 DIAGNOSIS — H2513 Age-related nuclear cataract, bilateral: Secondary | ICD-10-CM | POA: Diagnosis not present

## 2023-11-10 ENCOUNTER — Other Ambulatory Visit: Payer: Self-pay

## 2023-11-10 ENCOUNTER — Telehealth: Payer: Self-pay | Admitting: *Deleted

## 2023-11-10 DIAGNOSIS — Z87891 Personal history of nicotine dependence: Secondary | ICD-10-CM

## 2023-11-10 DIAGNOSIS — Z122 Encounter for screening for malignant neoplasm of respiratory organs: Secondary | ICD-10-CM

## 2023-11-10 NOTE — Telephone Encounter (Signed)
 Copied from CRM [id number]. Topic: Clinical - Request for Lab/Test Order >> Nov 09, 2023  2:44 PM Rilla B wrote: Reason for CRM: Patient calling for Camie Lites. States he is suppose to have a CT scan and Ms Lites has not put in the order, so he cannot schedule. Please call patient @ [phone]

## 2023-11-11 NOTE — Telephone Encounter (Signed)
 Patient aware of appt

## 2023-11-18 ENCOUNTER — Ambulatory Visit
Admission: RE | Admit: 2023-11-18 | Discharge: 2023-11-18 | Disposition: A | Discharge status: Home / Self Care | Source: Ambulatory Visit | Attending: Acute Care | Admitting: Acute Care

## 2023-11-18 DIAGNOSIS — Z122 Encounter for screening for malignant neoplasm of respiratory organs: Secondary | ICD-10-CM | POA: Insufficient documentation

## 2023-11-18 DIAGNOSIS — Z87891 Personal history of nicotine dependence: Secondary | ICD-10-CM | POA: Insufficient documentation

## 2023-11-25 ENCOUNTER — Other Ambulatory Visit: Payer: Self-pay

## 2023-11-25 DIAGNOSIS — Z87891 Personal history of nicotine dependence: Secondary | ICD-10-CM

## 2023-11-25 DIAGNOSIS — Z122 Encounter for screening for malignant neoplasm of respiratory organs: Secondary | ICD-10-CM

## 2023-12-16 ENCOUNTER — Ambulatory Visit: Payer: Self-pay | Admitting: Urology

## 2023-12-24 ENCOUNTER — Encounter: Payer: Self-pay | Admitting: Family Medicine

## 2023-12-24 ENCOUNTER — Other Ambulatory Visit: Payer: Self-pay | Admitting: Family Medicine

## 2023-12-24 ENCOUNTER — Ambulatory Visit (INDEPENDENT_AMBULATORY_CARE_PROVIDER_SITE_OTHER): Admitting: Family Medicine

## 2023-12-24 VITALS — BP 124/78 | HR 89 | Ht 67.0 in | Wt 142.0 lb

## 2023-12-24 DIAGNOSIS — R7309 Other abnormal glucose: Secondary | ICD-10-CM

## 2023-12-24 DIAGNOSIS — N401 Enlarged prostate with lower urinary tract symptoms: Secondary | ICD-10-CM

## 2023-12-24 DIAGNOSIS — R972 Elevated prostate specific antigen [PSA]: Secondary | ICD-10-CM

## 2023-12-24 DIAGNOSIS — J302 Other seasonal allergic rhinitis: Secondary | ICD-10-CM

## 2023-12-24 DIAGNOSIS — E785 Hyperlipidemia, unspecified: Secondary | ICD-10-CM

## 2023-12-24 DIAGNOSIS — Z0000 Encounter for general adult medical examination without abnormal findings: Secondary | ICD-10-CM

## 2023-12-24 DIAGNOSIS — J432 Centrilobular emphysema: Secondary | ICD-10-CM

## 2023-12-24 DIAGNOSIS — M06041 Rheumatoid arthritis without rheumatoid factor, right hand: Secondary | ICD-10-CM

## 2023-12-24 DIAGNOSIS — R3912 Poor urinary stream: Secondary | ICD-10-CM

## 2023-12-24 NOTE — Progress Notes (Signed)
 Subjective:    Patient ID: Derek Mayo, male    DOB: Sep 22, 1945, 78 y.o.   MRN: [id number]  Derek Mayo is a 78 y.o. male presenting on 12/24/2023 for Medical Management of Chronic Issues and COPD   HPI  Discussed the use of AI scribe software for clinical note transcription with the patient, who gave verbal consent to proceed.  History of Present Illness   Derek Mayo is a 78 year old male with COPD who presents with exacerbation of breathing difficulties.  COPD, Chronic Severe Dyspnea and respiratory exacerbations Followed by Pulmonology [facility] Dr Theotis - Significant breathing difficulties since May, following a trip to [facility] where he nearly collapsed after walking a short distance - Required assistance and used albuterol  inhaler (three puffs) during the episode, but was hesitant to take more due to concerns about heart rate elevation - Progressive decline in respiratory status since May, with increased frequency of exacerbations and greater reliance on albuterol  - Recent improvement in breathing over the past two weeks, with reduced need for albuterol  and improved ability to perform daily activities such as climbing stairs and riding his bicycle - Peak flow meter readings have increased by 50 points over the past few weeks  Bronchodilator and inhaler use - Increased frequency of albuterol  use since May exacerbation - Resorted to using Primatene mist due to fear of running out of albuterol  - Uses a portable nebulizer AS NEEDED - Issue with Incruse + ICS, was switched back to Trelegy with improvement - Reduced albuterol  use recently due to improved symptoms with Mullein herbal expectorant supplement  Supplement use and response - Started taking Mullein supplement recently, resulting in significant improvement in breathing over the past two weeks - Improvement in ability to perform daily activities and reduced need for albuterol   Upper respiratory  symptoms - Improved sinus clearance over the past few weeks - Reduced need for decongestants, which previously exacerbated breathing issues  Functional status - Active as a musician, 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      Elevated PSA BPH Followed by Dr Francisca Last visit with me 05/2023 PSA elevated previously 5.99 > Urology repeat PSA and PHI score that showed < 20% chance of prostate cancer      12/24/2023    2:19 PM 06/18/2023    8:07 AM 12/11/2022    8:26 AM  Depression screen PHQ 2/9  Decreased Interest 0 0 0  Down, Depressed, Hopeless 0 0 0  PHQ - 2 Score 0 0 0  Altered sleeping 0 0 0  Tired, decreased energy 1 1 0  Change in appetite 0 0 0  Feeling bad or failure about yourself  0 0 0  Trouble concentrating 0 0 0  Moving slowly or fidgety/restless 0 0 0  Suicidal thoughts 0 0 0  PHQ-9 Score 1 1 0  Difficult doing work/chores Not difficult at all Not difficult at all        06/18/2023    8:07 AM 12/11/2022    8:26 AM 06/11/2022    8:33 AM 09/05/2021    9:09 AM  GAD 7 : Generalized Anxiety Score  Nervous, Anxious, on Edge 0 1 0 0  Control/stop worrying 0 0 0 0  Worry too much - different things 0 0 0 0  Trouble relaxing 0 0 0 0  Restless 0 0 0 0  Easily annoyed or irritable 0 0 0 0  Afraid - awful might happen 0  0 0 0  Total GAD 7 Score 0 1 0 0  Anxiety Difficulty Not difficult at all  Not difficult at all Not difficult at all    Social History   Tobacco Use   Smoking status: Every Day    Current packs/day: 0.00    Average packs/day: 1 pack/day for 40.0 years (40.0 ttl pk-yrs)    Types: Cigars, Cigarettes    Start date: 08/13/1972    Last attempt to quit: 08/13/2012    Years since quitting: 11.3    Passive exposure: Current   Smokeless tobacco: Former  Building Services Engineer status: Never Used  Substance Use Topics   Alcohol use: Not Currently    Alcohol/week: 7.0 standard drinks of alcohol    Types: 7 Cans of beer per week    Drug use: Not Currently    Review of Systems Per HPI unless specifically indicated above     Objective:    BP 124/78 (BP Location: Left Arm, Patient Position: Sitting, Cuff Size: Normal)   Pulse 89   Ht 5' 7 (1.702 m)   Wt 142 lb (64.4 kg)   SpO2 94%   BMI 22.24 kg/m   Wt Readings from Last 3 Encounters:  12/24/23 142 lb (64.4 kg)  09/24/23 135 lb (61.2 kg)  06/18/23 144 lb 12.8 oz (65.7 kg)    Physical Exam Vitals and nursing note reviewed.  Constitutional:      General: He is not in acute distress.    Appearance: He is well-developed. He is not diaphoretic.     Comments: Well-appearing, comfortable, cooperative  HENT:     Head: Normocephalic and atraumatic.  Neck:     Thyroid : No thyromegaly.  Cardiovascular:     Rate and Rhythm: Normal rate and regular rhythm.     Pulses: Normal pulses.     Heart sounds: Normal heart sounds. No murmur heard. Pulmonary:     Effort: Pulmonary effort is normal. No respiratory distress.     Breath sounds: Normal breath sounds. No wheezing or rales.     Comments: Notable improved air movement today but still mild reduced Musculoskeletal:        General: No tenderness. Normal range of motion.     Cervical back: Normal range of motion and neck supple.     Comments: Upper / Lower Extremities: - Normal muscle tone, strength bilateral upper extremities 5/5, lower extremities 5/5  Lymphadenopathy:     Cervical: No cervical adenopathy.  Skin:    General: Skin is warm and dry.     Findings: No erythema or rash.  Neurological:     Mental Status: He is alert and oriented to person, place, and time.     Comments: Distal sensation intact to light touch all extremities  Psychiatric:        Mood and Affect: Mood normal.        Behavior: Behavior normal.        Thought Content: Thought content normal.     Comments: Well groomed, good eye contact, normal speech and thoughts     I have personally reviewed the radiology report from 11/18/23  on LDCT.  CT CHEST LUNG CA SCREEN LOW DOSE W/O CM [[id number]] Resulted: 11/25/23 1443  Order Status: Completed Updated: 11/25/23 1445  Narrative:    CLINICAL DATA:  Former 40 pack-year smoker, quit 2014.  EXAM: CT CHEST WITHOUT CONTRAST LOW-DOSE FOR LUNG CANCER SCREENING  TECHNIQUE: Multidetector CT imaging of the chest was performed following  the standard protocol without IV contrast.  RADIATION DOSE REDUCTION: This exam was performed according to the departmental dose-optimization program which includes automated exposure control, adjustment of the mA and/or kV according to patient size and/or use of iterative reconstruction technique.  COMPARISON:  10/08/2022.  FINDINGS: Cardiovascular: Atherosclerotic calcification of the aorta, aortic valve and left main coronary artery. Heart size normal. No pericardial effusion.  Mediastinum/Nodes: No pathologically enlarged mediastinal or axillary lymph nodes. Hilar regions are difficult to definitively evaluate without IV contrast. Esophagus is grossly unremarkable.  Lungs/Pleura: Centrilobular and paraseptal emphysema. Scattered pulmonary parenchymal scarring. No suspicious pulmonary nodules. No pleural fluid. Airway is unremarkable.  Upper Abdomen: Low-attenuation lesions in the kidneys. No specific follow-up necessary. Visualized portions of the liver, gallbladder, adrenal glands, kidneys, spleen, pancreas, stomach and bowel are otherwise grossly unremarkable. No upper abdominal adenopathy.  Musculoskeletal: Degenerative changes in the spine. Osteopenia. 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  IMPRESSION: 1. Lung-RADS 1, negative. Continue annual screening with low-dose chest CT without contrast in 12 months. 2. Aortic atherosclerosis (ICD10-I70.0). Left main coronary artery calcification. 3.  Emphysema (ICD10-J43.9).   Electronically Signed   By: Newell Eke M.D.   On: 11/25/2023 14:43    Results for orders placed or  performed in visit on 09/17/23  Prostate Health Index   Collection Time: 09/17/23 10:24 AM  Result Value Ref Range   Prostate Specific Ag 6.7 (H) 0.0 - 3.9 ng/mL  PHI Score Reflex   Collection Time: 09/17/23 10:24 AM  Result Value Ref Range   PSA, Free 0.82 N/A ng/mL   % Free PSA 12.3 %   p2PSA 9.4 N/A pg/mL   Prostate Heath Index Score 29.5       Assessment & Plan:   Problem List Items Addressed This Visit     Benign prostatic hyperplasia with weak urinary stream   Centrilobular emphysema (HCC) - Primary   Chronic seasonal allergic rhinitis   Other Visit Diagnoses       Elevated PSA           Severe COPD Followed by Pulmonology [facility]. Dr Theotis Severe COPD with limited lung function and reduced air flow. Significant improvement in past 6 months following flare and new med regimen with Mullein supplement and back on Trelegy - Continue Trelegy for COPD management. (Note prior change from Incruse + Qvar) - Continue Mullein, which has helped as expectorant / sputum clearing  - Continue Albuterol  PRN - Suggested that he discussed Ohtuvayre  with Dr Theotis Pulmonology as an option for future add on therapy.  Allergic rhinitis with sinus congestion Chronic sinus congestion improved with Mullein supplement, reducing sinus congestion and headaches.  Avoid Sudafed due to interaction with Flomax .  Elevated prostate specific antigen BPH LUTS Followed by Urology Dr Francisca Previously elevated PSA, some fluctuation. Urology has used [facility] Repeat labs in April 2026   No orders of the defined types were placed in this encounter.   No orders of the defined types were placed in this encounter.   Follow up plan: Return for 6 month fasting lab > 1 week later Annual Physical.  Future labs ordered for 06/22/24   Marsa Officer, DO [facility] 12/24/2023, 8:10 AM

## 2023-12-24 NOTE — Patient Instructions (Addendum)
 Thank you for coming to the office today.  Keep on the Mullein supplement  Continue Trelegy  Repeat labs 6 months in April with PSA included.  Ask Dr Theotis about Ohtuvayre  (ensifentrine ) - new FDA approved nebulized COPD medication from June 2024. It is brand name and higher cost but may be approved.  Please schedule a Follow-up Appointment to: Return for 6 month fasting lab > 1 week later Annual Physical.  If you have any other questions or concerns, please feel free to call the office or send a message through MyChart. You may also schedule an earlier appointment if necessary.  Additionally, you may be receiving a survey about your experience at our office within a few days to 1 week by e-mail or mail. We value your feedback.  Marsa Officer, DO [facility] [place], [place]

## 2024-02-23 ENCOUNTER — Other Ambulatory Visit: Payer: Self-pay | Admitting: Family Medicine

## 2024-02-23 DIAGNOSIS — J302 Other seasonal allergic rhinitis: Secondary | ICD-10-CM

## 2024-02-24 NOTE — Telephone Encounter (Signed)
 Requested medication (s) are due for refill today: expired medication date  Requested medication (s) are on the active medication list: yes   Last refill:  02/11/23 #48 ml 3 refills  Future visit scheduled: yes 06/28/24  Notes to clinic:  expired medication date. Do you want to renew Rx?     Requested Prescriptions  Pending Prescriptions Disp Refills   fluticasone  (FLONASE ) 50 MCG/ACT nasal spray [Pharmacy Med Name: FLUTICASONE  PROP 50 MCG SPRAY] 48 mL 3    Sig: SPRAY 2 SPRAYS INTO EACH NOSTRIL EVERY DAY     Ear, Nose, and Throat: Nasal Preparations - Corticosteroids Passed - 02/24/2024 12:05 PM      Passed - Valid encounter within last 12 months    Recent Outpatient Visits           2 months ago Centrilobular emphysema [facility])   [facility] Prien, Marsa PARAS, DO   8 months ago Annual physical exam   [facility] [place] Marsa PARAS, DO       Future Appointments             In 7 months Francisca, Redell BROCKS, MD [facility] [place]

## 2024-06-16 ENCOUNTER — Other Ambulatory Visit

## 2024-06-22 ENCOUNTER — Other Ambulatory Visit

## 2024-06-28 ENCOUNTER — Encounter: Admitting: Family Medicine

## 2024-09-23 ENCOUNTER — Ambulatory Visit: Admitting: Urology
